# Patient Record
Sex: Male | Born: 1950 | Race: White | Hispanic: No | Marital: Single | Smoking: Never smoker
Health system: Southern US, Community
[De-identification: ages and names within clinical notes are randomized; demographics above are authoritative.]

## PROBLEM LIST (undated history)

## (undated) DIAGNOSIS — K589 Irritable bowel syndrome without diarrhea: Secondary | ICD-10-CM

## (undated) DIAGNOSIS — I10 Essential (primary) hypertension: Secondary | ICD-10-CM

## (undated) DIAGNOSIS — K5792 Diverticulitis of intestine, part unspecified, without perforation or abscess without bleeding: Secondary | ICD-10-CM

## (undated) DIAGNOSIS — F419 Anxiety disorder, unspecified: Secondary | ICD-10-CM

## (undated) DIAGNOSIS — E785 Hyperlipidemia, unspecified: Secondary | ICD-10-CM

## (undated) DIAGNOSIS — K219 Gastro-esophageal reflux disease without esophagitis: Secondary | ICD-10-CM

## (undated) DIAGNOSIS — R202 Paresthesia of skin: Secondary | ICD-10-CM

## (undated) HISTORY — PX: ABDOMINAL SURGERY: SHX537

## (undated) HISTORY — PX: EYE SURGERY: SHX253

## (undated) HISTORY — DX: Anxiety disorder, unspecified: F41.9

## (undated) HISTORY — DX: Paresthesia of skin: R20.2

## (undated) HISTORY — DX: Hyperlipidemia, unspecified: E78.5

---

## 2015-07-12 ENCOUNTER — Encounter (HOSPITAL_COMMUNITY): Payer: Self-pay | Admitting: Emergency Medicine

## 2015-07-12 ENCOUNTER — Emergency Department (HOSPITAL_COMMUNITY): Payer: BLUE CROSS/BLUE SHIELD

## 2015-07-12 ENCOUNTER — Inpatient Hospital Stay (HOSPITAL_COMMUNITY)
Admission: EM | Admit: 2015-07-12 | Discharge: 2015-07-21 | DRG: 392 | Disposition: A | Discharge status: Home / Self Care | Payer: BLUE CROSS/BLUE SHIELD | Attending: General Surgery | Admitting: General Surgery

## 2015-07-12 DIAGNOSIS — K5720 Diverticulitis of large intestine with perforation and abscess without bleeding: Principal | ICD-10-CM | POA: Diagnosis present

## 2015-07-12 DIAGNOSIS — Z79899 Other long term (current) drug therapy: Secondary | ICD-10-CM

## 2015-07-12 DIAGNOSIS — R3915 Urgency of urination: Secondary | ICD-10-CM | POA: Diagnosis not present

## 2015-07-12 DIAGNOSIS — F419 Anxiety disorder, unspecified: Secondary | ICD-10-CM | POA: Diagnosis present

## 2015-07-12 DIAGNOSIS — K219 Gastro-esophageal reflux disease without esophagitis: Secondary | ICD-10-CM | POA: Diagnosis present

## 2015-07-12 DIAGNOSIS — I10 Essential (primary) hypertension: Secondary | ICD-10-CM | POA: Diagnosis present

## 2015-07-12 DIAGNOSIS — R109 Unspecified abdominal pain: Secondary | ICD-10-CM | POA: Diagnosis not present

## 2015-07-12 DIAGNOSIS — K5732 Diverticulitis of large intestine without perforation or abscess without bleeding: Secondary | ICD-10-CM

## 2015-07-12 HISTORY — DX: Essential (primary) hypertension: I10

## 2015-07-12 HISTORY — DX: Gastro-esophageal reflux disease without esophagitis: K21.9

## 2015-07-12 HISTORY — DX: Diverticulitis of intestine, part unspecified, without perforation or abscess without bleeding: K57.92

## 2015-07-12 HISTORY — DX: Irritable bowel syndrome, unspecified: K58.9

## 2015-07-12 LAB — COMPREHENSIVE METABOLIC PANEL
ALT: 25 U/L (ref 17–63)
AST: 24 U/L (ref 15–41)
Albumin: 4.8 g/dL (ref 3.5–5.0)
Alkaline Phosphatase: 81 U/L (ref 38–126)
Anion gap: 10 (ref 5–15)
BUN: 12 mg/dL (ref 6–20)
CO2: 24 mmol/L (ref 22–32)
Calcium: 9.7 mg/dL (ref 8.9–10.3)
Chloride: 104 mmol/L (ref 101–111)
Creatinine, Ser: 0.93 mg/dL (ref 0.61–1.24)
GFR calc Af Amer: >60 mL/min (ref >60)
GFR calc non Af Amer: >60 mL/min (ref >60)
Glucose, Bld: 144 mg/dL — ABNORMAL HIGH (ref 65–99)
Potassium: 3.9 mmol/L (ref 3.5–5.1)
Sodium: 138 mmol/L (ref 135–145)
Total Bilirubin: 1.1 mg/dL (ref 0.3–1.2)
Total Protein: 7.7 g/dL (ref 6.5–8.1)

## 2015-07-12 LAB — CBC
HCT: 43.8 % (ref 39.0–52.0)
Hemoglobin: 15.1 g/dL (ref 13.0–17.0)
MCH: 30.1 pg (ref 26.0–34.0)
MCHC: 34.5 g/dL (ref 30.0–36.0)
MCV: 87.4 fL (ref 78.0–100.0)
Platelets: 325 10*3/uL (ref 150–400)
RBC: 5.01 MIL/uL (ref 4.22–5.81)
RDW: 13.3 % (ref 11.5–15.5)
WBC: 18.1 10*3/uL — ABNORMAL HIGH (ref 4.0–10.5)

## 2015-07-12 LAB — URINALYSIS, ROUTINE W REFLEX MICROSCOPIC
Bilirubin Urine: NEGATIVE
Glucose, UA: NEGATIVE mg/dL
Hgb urine dipstick: NEGATIVE
Ketones, ur: NEGATIVE mg/dL
Leukocytes, UA: NEGATIVE
Nitrite: NEGATIVE
Protein, ur: NEGATIVE mg/dL
Specific Gravity, Urine: 1.017 (ref 1.005–1.030)
pH: 7.5 (ref 5.0–8.0)

## 2015-07-12 LAB — LIPASE, BLOOD: Lipase: 34 U/L (ref 11–51)

## 2015-07-12 MED ORDER — SODIUM CHLORIDE 0.9 % IV BOLUS (SEPSIS)
1000.0000 mL | Freq: Once | INTRAVENOUS | Status: AC
  Administered 2015-07-12: 1000 mL via INTRAVENOUS

## 2015-07-12 MED ORDER — ACETAMINOPHEN 325 MG PO TABS
650.0000 mg | ORAL_TABLET | Freq: Four times a day (QID) | ORAL | Status: DC | PRN
  Administered 2015-07-13: 650 mg via ORAL
  Filled 2015-07-12: qty 2

## 2015-07-12 MED ORDER — ACETAMINOPHEN 650 MG RE SUPP
650.0000 mg | Freq: Four times a day (QID) | RECTAL | Status: DC | PRN
  Filled 2015-07-12: qty 1

## 2015-07-12 MED ORDER — ONDANSETRON 4 MG PO TBDP: 4.0000 mg | ORAL_TABLET | Freq: Four times a day (QID) | ORAL | Status: DC | PRN

## 2015-07-12 MED ORDER — IOPAMIDOL (ISOVUE-300) INJECTION 61%
100.0000 mL | Freq: Once | INTRAVENOUS | Status: AC | PRN
  Administered 2015-07-12: 100 mL via INTRAVENOUS

## 2015-07-12 MED ORDER — ERTAPENEM SODIUM 1 G IJ SOLR
1.0000 g | Freq: Once | INTRAMUSCULAR | Status: AC
  Administered 2015-07-12: 1 g via INTRAVENOUS
  Filled 2015-07-12: qty 1

## 2015-07-12 MED ORDER — ONDANSETRON HCL 4 MG/2ML IJ SOLN
4.0000 mg | Freq: Four times a day (QID) | INTRAMUSCULAR | Status: DC | PRN
  Administered 2015-07-12 – 2015-07-14 (×4): 4 mg via INTRAVENOUS
  Filled 2015-07-12 (×4): qty 2

## 2015-07-12 MED ORDER — MORPHINE SULFATE (PF) 4 MG/ML IV SOLN
4.0000 mg | Freq: Once | INTRAVENOUS | Status: AC
Start: 2015-07-12 — End: 2015-07-12
  Administered 2015-07-12: 4 mg via INTRAVENOUS
  Filled 2015-07-12: qty 1

## 2015-07-12 MED ORDER — HYDROMORPHONE HCL 1 MG/ML IJ SOLN
1.0000 mg | INTRAMUSCULAR | Status: DC | PRN
  Administered 2015-07-12 – 2015-07-15 (×11): 1 mg via INTRAVENOUS
  Filled 2015-07-12 (×11): qty 1

## 2015-07-12 NOTE — H&P (Signed)
Walter Freeman is an 65 y.o. male.    Chief Complaint: perforated diverticulitis  HPI: 89 yom who is fairly healthy with psh of IH presents with abdominal pain. He has history of colonoscopy about 10-12 years ago.  He was admitted for at least 2 weeks he states to hospital in [place] for diverticular disease.  He got better slowly but sounds like he was on some abx and antifungals.  He also eventually went for laparoscopic washout and drain placement. He was discharged it sounds like without drain about three days later. He has done pretty well since then and has moved to this area. Yesterday night he had acute onset abdominal pain.  He was able to sleep after using xanax and vicodin as directed by physician.  He then progressed today and there was no relief.  He is passing flatus.  He comes in and I was called after ct was performed.   Past Medical History  Diagnosis Date  . Diverticulitis   . GERD (gastroesophageal reflux disease)   . Hypertension   . IBS (irritable bowel syndrome)     Past Surgical History  Procedure Laterality Date  . Abdominal surgery    IH repair  No family history on file. Social History:  reports that he has never smoked. He does not have any smokeless tobacco history on file. He reports that he does not drink alcohol. His drug history is not on file.  Allergies:    meds antihtn  Results for orders placed or performed during the hospital encounter of 07/12/15 (from the past 48 hour(s))  Lipase, blood     Status: None   Collection Time: 07/12/15 12:17 PM  Result Value Ref Range   Lipase 34 11 - 51 U/L  Comprehensive metabolic panel     Status: Abnormal   Collection Time: 07/12/15 12:17 PM  Result Value Ref Range   Sodium 138 135 - 145 mmol/L   Potassium 3.9 3.5 - 5.1 mmol/L   Chloride 104 101 - 111 mmol/L   CO2 24 22 - 32 mmol/L   Glucose, Bld 144 (H) 65 - 99 mg/dL   BUN 12 6 - 20 mg/dL   Creatinine, Ser 0.93 0.61 - 1.24 mg/dL   Calcium 9.7 8.9 - 10.3  mg/dL   Total Protein 7.7 6.5 - 8.1 g/dL   Albumin 4.8 3.5 - 5.0 g/dL   AST 24 15 - 41 U/L   ALT 25 17 - 63 U/L   Alkaline Phosphatase 81 38 - 126 U/L   Total Bilirubin 1.1 0.3 - 1.2 mg/dL   GFR calc non Af Amer >60 >60 mL/min   GFR calc Af Amer >60 >60 mL/min    Comment: (NOTE) The eGFR has been calculated using the CKD EPI equation. This calculation has not been validated in all clinical situations. eGFR's persistently <60 mL/min signify possible Chronic Kidney Disease.    Anion gap 10 5 - 15  CBC     Status: Abnormal   Collection Time: 07/12/15 12:17 PM  Result Value Ref Range   WBC 18.1 (H) 4.0 - 10.5 K/uL   RBC 5.01 4.22 - 5.81 MIL/uL   Hemoglobin 15.1 13.0 - 17.0 g/dL   HCT 43.8 39.0 - 52.0 %   MCV 87.4 78.0 - 100.0 fL   MCH 30.1 26.0 - 34.0 pg   MCHC 34.5 30.0 - 36.0 g/dL   RDW 13.3 11.5 - 15.5 %   Platelets 325 150 - 400 K/uL  Urinalysis, Routine  w reflex microscopic (not at [facility])     Status: None   Collection Time: 07/12/15 12:17 PM  Result Value Ref Range   Color, Urine YELLOW YELLOW   APPearance CLEAR CLEAR   Specific Gravity, Urine 1.017 1.005 - 1.030   pH 7.5 5.0 - 8.0   Glucose, UA NEGATIVE NEGATIVE mg/dL   Hgb urine dipstick NEGATIVE NEGATIVE   Bilirubin Urine NEGATIVE NEGATIVE   Ketones, ur NEGATIVE NEGATIVE mg/dL   Protein, ur NEGATIVE NEGATIVE mg/dL   Nitrite NEGATIVE NEGATIVE   Leukocytes, UA NEGATIVE NEGATIVE    Comment: MICROSCOPIC NOT DONE ON URINES WITH NEGATIVE PROTEIN, BLOOD, LEUKOCYTES, NITRITE, OR GLUCOSE <1000 mg/dL.   Ct Abdomen Pelvis W Contrast  07/12/2015  CLINICAL DATA:  Abdominal pain starting last night. History of diverticulitis. EXAM: CT ABDOMEN AND PELVIS WITH CONTRAST TECHNIQUE: Multidetector CT imaging of the abdomen and pelvis was performed using the standard protocol following bolus administration of intravenous contrast. CONTRAST:  140m ISOVUE-300 IOPAMIDOL (ISOVUE-300) INJECTION 61% COMPARISON:  None. FINDINGS: Lower chest:   Mild bibasilar atelectasis. Hepatobiliary: Liver is low in density suggesting fatty infiltration. No focal mass or lesion within the liver. Gallbladder is mildly distended but otherwise unremarkable. No evidence of cholecystitis. No bile duct dilatation. Pancreas: No mass, inflammatory changes, or other significant abnormality. Spleen: Within normal limits in size and appearance. Adrenals/Urinary Tract: No masses identified. No evidence of hydronephrosis. Stomach/Bowel: There is thickening of the walls of the mid sigmoid colon, and paracolic fluid stranding noted about this segment of the sigmoid colon, with scattered diverticulosis throughout, consistent with acute diverticulitis. Ill-defined collection of fluid and extraluminal air noted within the central pelvis, suspicious for early developing abscess collection which measures approximately 2.3 cm greatest thickness and 4.9 cm craniocaudal dimension, indicating an adjacent perforated diverticulitis. Distal small bowel abuts this extraluminal collection, with associated thickening/inflammation of the walls of the small bowel, and possible associated fistulous connection. Remainder of the bowel appears normal in caliber and configuration. No other site of bowel wall thickening or inflammation. Appendix is normal. Free intraperitoneal air noted within the upper abdomen, again indicating bowel perforation. Vascular/Lymphatic: Scattered atherosclerotic changes of the normal- caliber abdominal aorta. No acute - appearing vascular abnormality. Reproductive: No mass or other significant abnormality. Other: Small amount of additional free fluid in the lower pelvis. Free intraperitoneal air within the upper abdomen, as above. Musculoskeletal: No acute or suspicious osseous lesion. Mild degenerative change within the lumbar spine. 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 IMPRESSION: 1. Thickening  of the walls of the mid sigmoid colon, with associated paracolic fluid stranding, consistent with acute diverticulitis. Free intraperitoneal air indicates associated bowel perforation (perforated diverticulitis). 2. Thin collection of fluid and extraluminal air within the midline pelvis, measuring 2.3 cm greatest thickness and approximately 4.9 cm craniocaudal dimension, located immediately adjacent to the segment of sigmoid colon diverticulitis, is highly suggestive of early developing abscess. 3. The complex collection in the midline pelvis, suspected to be early abscess formation, also abuts the adjacent distal small bowel, with associated thickening/inflammation of the walls of these small bowel loops which could be reactive thickening or indicate abscess involvement and/or fistulous connection. 4. As above, there is free intraperitoneal air within the upper abdomen, indicating bowel perforation (perforated sigmoid diverticulitis). Critical Value/emergent results were called by telephone at the time of interpretation on 07/12/2015 at 8:03 pm to Dr. CDalia Heading, who verbally acknowledged these results. Electronically Signed   By:  Franki Cabot M.D.   On: 07/12/2015 20:11    Review of Systems  Constitutional: Negative for fever and chills.  Respiratory: Negative for cough and shortness of breath.   Cardiovascular: Negative for chest pain.  Gastrointestinal: Positive for nausea and abdominal pain. Negative for vomiting.    Blood pressure 134/64, pulse 111, temperature 98.2 F (36.8 C), temperature source Oral, resp. rate 18, SpO2 95 %. Physical Exam  Vitals reviewed. Constitutional: He appears well-developed and well-nourished.  Eyes: No scleral icterus.  Cardiovascular: Normal rate, regular rhythm and normal heart sounds.   Respiratory: Effort normal and breath sounds normal. He has no wheezes. He has no rales.  GI: He exhibits no distension. Bowel sounds are decreased. There is tenderness  in the periumbilical area and left lower quadrant. There is guarding. No hernia.  Lymphadenopathy:    He has no cervical adenopathy.     Assessment/Plan Perforated diverticulitis  He is suprisingly well given ct scan.  I am certainly concerned about his presentation. I discussed option of going to or tonight for sigmoid colectomy and colostomy vs attempt (and hope that perforation has sealed and is not ongoing as there appears to be early abscess in pelvis) to treat conservatively (possibly drain and abx).  He understands that this may fail and if he worsens he will need surgery tonight.  He also understands this may fail and need surgery in next 24-48 hours.  I think this is not unreasonable given the way he looks right now. Will proceed with monitoring, recheck labs in am, abx, npo and hopefully gets better quickly. If he worsens or does not improve he will need hartmanns procedure.   Rolm Bookbinder, MD 07/12/2015, 9:57 PM

## 2015-07-12 NOTE — ED Notes (Signed)
Patient transported to CT 

## 2015-07-12 NOTE — Progress Notes (Signed)
Patient listed as having [employer] insurance without a pcp.  EDCM spoke to patient at bedside.  Patient confirms he does not have a pcp.  EDCM offered to give patient list of pcps who accept [employer] insurance, however EDCM unable to do so at this time as [employer] web site is not functioning properly.  EDCM instructed patient to cal lthe phone number on the back of his insurance card to assist him in finding a pcp who is close to him and within network.  Patient thankful for services.  No further EDCM needs at this time.

## 2015-07-12 NOTE — ED Notes (Signed)
Per [facility] pt from home reports abd pain started last night , denies nausea nor emesis, no diarrhea. Hx diverticulitis . HX HTN , per EMS BP is 170/96. Alert and oriented x 4. Ambulated from stretcher to triage room , took 1 oxycodone 30 min ago .

## 2015-07-12 NOTE — ED Provider Notes (Signed)
CSN: [id number]     Arrival date & time 07/12/15  1158 History   First MD Initiated Contact with Patient 07/12/15 1622     Chief Complaint  Patient presents with  . Abdominal Pain     (Consider location/radiation/quality/duration/timing/severity/associated sxs/prior Treatment) HPI Patient presents to the emergency department with worsening abdominal pain since last night.  The patient states back in January and diverticulitis with microperforation and was [place].  The patient states he was admitted to the hospital for 3 weeks.  Patient states that time he had a laparoscopic lavaged on to help with healing of the area.  Patient states that he has been having no difficulties with eating or bowel movement since that time.  Patient states that he recently moved to [place].  Patient states that he took pain medication that he had previously from his other hospitalization.  Patient states that palpation of his abdomen makes the pain worse.The patient denies chest pain, shortness of breath, headache,blurred vision, neck pain, fever, cough, weakness, numbness, dizziness, anorexia, edema, vomiting, diarrhea, rash, back pain, dysuria, hematemesis, bloody stool, near syncope, or syncope. Past Medical History  Diagnosis Date  . Diverticulitis   . GERD (gastroesophageal reflux disease)   . Hypertension   . IBS (irritable bowel syndrome)    Past Surgical History  Procedure Laterality Date  . Abdominal surgery     No family history on file. Social History  Substance Use Topics  . Smoking status: Never Smoker   . Smokeless tobacco: None  . Alcohol Use: No    Review of Systems  All other systems negative except as documented in the HPI. All pertinent positives and negatives as reviewed in the HPI.  Allergies  Lisinopril; Other; and Penicillins  Home Medications   Prior to Admission medications   Medication Sig Start Date End Date Taking? Authorizing  Provider  ALPRAZolam (XANAX) 0.25 MG tablet Take 0.25 mg by mouth 4 (four) times daily as needed for anxiety.   Yes Historical Provider, MD  amLODipine (NORVASC) 10 MG tablet Take 10 mg by mouth daily.   Yes Historical Provider, MD  HYDROcodone-acetaminophen (NORCO/VICODIN) 5-325 MG tablet Take 1-2 tablets by mouth every 4 (four) hours as needed (pain.).   Yes Historical Provider, MD  omeprazole (PRILOSEC) 20 MG capsule Take 20 mg by mouth daily.   Yes Historical Provider, MD   BP 147/83 mmHg  Pulse 106  Temp(Src) 98.2 F (36.8 C) (Oral)  Resp 16  SpO2 98% Physical Exam  Constitutional: He is oriented to person, place, and time. He appears well-developed and well-nourished. No distress.  HENT:  Head: Normocephalic and atraumatic.  Mouth/Throat: Oropharynx is clear and moist.  Eyes: Pupils are equal, round, and reactive to light.  Neck: Normal range of motion. Neck supple.  Cardiovascular: Normal rate, regular rhythm and normal heart sounds.  Exam reveals no gallop and no friction rub.   No murmur heard. Pulmonary/Chest: Effort normal and breath sounds normal. No respiratory distress. He has no wheezes.  Abdominal: Soft. Bowel sounds are normal. He exhibits no distension. There is tenderness. There is no rebound and no guarding.  Neurological: He is alert and oriented to person, place, and time. He exhibits normal muscle tone. Coordination normal.  Skin: Skin is warm and dry. No rash noted. No erythema.  Psychiatric: He has a normal mood and affect. His behavior is normal.  Nursing note and vitals reviewed.   ED Course  Procedures (including critical  care time) Labs Review Labs Reviewed  COMPREHENSIVE METABOLIC PANEL - Abnormal; Notable for the following:    Glucose, Bld 144 (*)    All other components within normal limits  CBC - Abnormal; Notable for the following:    WBC 18.1 (*)    All other components within normal limits  LIPASE, BLOOD  URINALYSIS, ROUTINE W REFLEX  MICROSCOPIC (NOT AT [facility])    Imaging Review Ct Abdomen Pelvis W Contrast  07/12/2015  CLINICAL DATA:  Abdominal pain starting last night. History of diverticulitis. EXAM: CT ABDOMEN AND PELVIS WITH CONTRAST TECHNIQUE: Multidetector CT imaging of the abdomen and pelvis was performed using the standard protocol following bolus administration of intravenous contrast. CONTRAST:  ISOVUE-300 IOPAMIDOL (ISOVUE-300) INJECTION 61% COMPARISON:  None. FINDINGS: Lower chest:  Mild bibasilar atelectasis. Hepatobiliary: Liver is low in density suggesting fatty infiltration. No focal mass or lesion within the liver. Gallbladder is mildly distended but otherwise unremarkable. No evidence of cholecystitis. No bile duct dilatation. Pancreas: No mass, inflammatory changes, or other significant abnormality. Spleen: Within normal limits in size and appearance. Adrenals/Urinary Tract: No masses identified. No evidence of hydronephrosis. Stomach/Bowel: There is thickening of the walls of the mid sigmoid colon, and paracolic fluid stranding noted about this segment of the sigmoid colon, with scattered diverticulosis throughout, consistent with acute diverticulitis. Ill-defined collection of fluid and extraluminal air noted within the central pelvis, suspicious for early developing abscess collection which measures approximately 2.3 cm greatest thickness and 4.9 cm craniocaudal dimension, indicating an adjacent perforated diverticulitis. Distal small bowel abuts this extraluminal collection, with associated thickening/inflammation of the walls of the small bowel, and possible associated fistulous connection. Remainder of the bowel appears normal in caliber and configuration. No other site of bowel wall thickening or inflammation. Appendix is normal. Free intraperitoneal air noted within the upper abdomen, again indicating bowel perforation. Vascular/Lymphatic: Scattered atherosclerotic changes of the normal- caliber abdominal  aorta. No acute - appearing vascular abnormality. Reproductive: No mass or other significant abnormality. Other: Small amount of additional free fluid in the lower pelvis. Free intraperitoneal air within the upper abdomen, as above. Musculoskeletal: No acute or suspicious osseous lesion. Mild degenerative change within the lumbar spine. 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 IMPRESSION: 1. Thickening of the walls of the mid sigmoid colon, with associated paracolic fluid stranding, consistent with acute diverticulitis. Free intraperitoneal air indicates associated bowel perforation (perforated diverticulitis). 2. Thin collection of fluid and extraluminal air within the midline pelvis, measuring 2.3 cm greatest thickness and approximately 4.9 cm craniocaudal dimension, located immediately adjacent to the segment of sigmoid colon diverticulitis, is highly suggestive of early developing abscess. 3. The complex collection in the midline pelvis, suspected to be early abscess formation, also abuts the adjacent distal small bowel, with associated thickening/inflammation of the walls of these small bowel loops which could be reactive thickening or indicate abscess involvement and/or fistulous connection. 4. As above, there is free intraperitoneal air within the upper abdomen, indicating bowel perforation (perforated sigmoid diverticulitis). Critical Value/emergent results were called by telephone at the time of interpretation on 07/12/2015 at 8:03 pm to Dr. Charlestine Night , who verbally acknowledged these results. Electronically Signed   By: Bary Richard M.D.   On: 07/12/2015 20:11   I have personally reviewed and evaluated these images and lab results as part of my medical decision-making.  I spoke with general surgery about the patient and he reviewed his CT scan.  He  will be in to evaluate the patient for admission.  Patient started on  Invanz. patient is advised plan and all questions were answered   Charlestine Night, PA-C 07/13/15 0154  Benjiman Core, MD 07/14/15 [phone]

## 2015-07-13 ENCOUNTER — Encounter (HOSPITAL_COMMUNITY): Payer: Self-pay | Admitting: Rehabilitation

## 2015-07-13 LAB — CBC
HCT: 39.3 % (ref 39.0–52.0)
Hemoglobin: 13.1 g/dL (ref 13.0–17.0)
MCH: 30.2 pg (ref 26.0–34.0)
MCHC: 33.3 g/dL (ref 30.0–36.0)
MCV: 90.6 fL (ref 78.0–100.0)
Platelets: 273 10*3/uL (ref 150–400)
RBC: 4.34 MIL/uL (ref 4.22–5.81)
RDW: 13.4 % (ref 11.5–15.5)
WBC: 19.1 10*3/uL — ABNORMAL HIGH (ref 4.0–10.5)

## 2015-07-13 LAB — BASIC METABOLIC PANEL
Anion gap: 10 (ref 5–15)
BUN: 12 mg/dL (ref 6–20)
CO2: 24 mmol/L (ref 22–32)
Calcium: 8.7 mg/dL — ABNORMAL LOW (ref 8.9–10.3)
Chloride: 107 mmol/L (ref 101–111)
Creatinine, Ser: 0.94 mg/dL (ref 0.61–1.24)
GFR calc Af Amer: >60 mL/min (ref >60)
GFR calc non Af Amer: >60 mL/min (ref >60)
Glucose, Bld: 138 mg/dL — ABNORMAL HIGH (ref 65–99)
Potassium: 4 mmol/L (ref 3.5–5.1)
Sodium: 141 mmol/L (ref 135–145)

## 2015-07-13 MED ORDER — LORAZEPAM 2 MG/ML IJ SOLN
1.0000 mg | Freq: Four times a day (QID) | INTRAMUSCULAR | Status: DC | PRN
  Administered 2015-07-13 – 2015-07-15 (×6): 1 mg via INTRAVENOUS
  Filled 2015-07-13 (×6): qty 1

## 2015-07-13 MED ORDER — SODIUM CHLORIDE 0.9 % IV SOLN
500.0000 mg | Freq: Four times a day (QID) | INTRAVENOUS | Status: DC
  Administered 2015-07-13 – 2015-07-20 (×31): 500 mg via INTRAVENOUS
  Filled 2015-07-13 (×31): qty 500

## 2015-07-13 MED ORDER — ENOXAPARIN SODIUM 40 MG/0.4ML ~~LOC~~ SOLN
40.0000 mg | SUBCUTANEOUS | Status: DC
  Administered 2015-07-13 – 2015-07-21 (×8): 40 mg via SUBCUTANEOUS
  Filled 2015-07-13 (×9): qty 0.4

## 2015-07-13 MED ORDER — PROCHLORPERAZINE EDISYLATE 5 MG/ML IJ SOLN
5.0000 mg | Freq: Four times a day (QID) | INTRAMUSCULAR | Status: DC | PRN
  Administered 2015-07-13: 5 mg via INTRAVENOUS
  Filled 2015-07-13: qty 2

## 2015-07-13 MED ORDER — SODIUM CHLORIDE 0.9 % IV SOLN
INTRAVENOUS | Status: DC
  Administered 2015-07-13 – 2015-07-16 (×10): via INTRAVENOUS
  Administered 2015-07-18: 50 mL via INTRAVENOUS
  Administered 2015-07-19 – 2015-07-21 (×4): via INTRAVENOUS

## 2015-07-13 MED ORDER — PANTOPRAZOLE SODIUM 40 MG IV SOLR
40.0000 mg | Freq: Every day | INTRAVENOUS | Status: DC
  Administered 2015-07-13 – 2015-07-16 (×5): 40 mg via INTRAVENOUS
  Filled 2015-07-13 (×5): qty 40

## 2015-07-13 NOTE — Progress Notes (Signed)
Pharmacy Antibiotic Note  Walter ManMichael Freeman is a 65 y.o. male admitted on 07/12/2015 with perforated diverticulitis.  Pharmacy has been consulted for Primaxin dosing.  Plan: Primaxin 500mg  iv q6hr  Height: 5\' 11"  (180.3 cm) Weight: 180 lb 12.4 oz (82 kg) IBW/kg (Calculated) : 75.3  Temp (24hrs), Avg:99.8 F (37.7 C), Min:98.2 F (36.8 C), Max:102.1 F (38.9 C)   Recent Labs Lab 07/12/15 1217  WBC 18.1*  CREATININE 0.93    Estimated Creatinine Clearance: 86.6 mL/min (by C-G formula based on Cr of 0.93).    Allergies  Allergen Reactions  . Lisinopril Anaphylaxis  . Other     EITHER Bactrim OR Biaxin; pt does not remember which.  Gas and bloating.   Mahi mahi fish caused lips to swell.    Marland Kitchen. Penicillins Swelling and Rash        Antimicrobials this admission: Primaxin 3/31 >>     Microbiology results: Pending  Thank you for allowing pharmacy to be a part of this patient's care.  Aleene DavidsonGrimsley Jr, Americus Perkey Crowford 07/13/2015 5:43 AM

## 2015-07-13 NOTE — Progress Notes (Signed)
Patient ID: Walter Freeman, male   DOB: 08/09/1950, 65 y.o.   MRN: [id number]     [facility]      [address] [place], [place] [address]    Phone: [phone] FAX: [phone]     Subjective: Pain unchanged.  102 temp at 2300.  BP stable.  No tachycardia or hypotension.  Objective:  Vital signs:  Filed Vitals:   07/12/15 2318 07/12/15 2343 07/13/15 0004 07/13/15 0531  BP: 130/72  135/75 131/66  Pulse: 105  103 97  Temp:  102.1 F (38.9 C) 99.8 F (37.7 C) 98.9 F (37.2 C)  TempSrc:  Oral Oral Oral  Resp: _0 Height:   _1  (1.803 m)   Weight:   82 kg (180 lb 12.4 oz)   SpO2: 95%  94% 95%       Intake/Output   Yesterday:  03/30 0701 - 03/31 0700 In: -  Out: 175 [Urine:175] This shift:    I/O last 3 completed shifts: In: -  Out: 175 [Urine:175]    Physical Exam: General: Pt awake/alert/oriented x4 in no acute distress Chest: cta.  No chest wall pain w good excursion CV:  Pulses intact.  Regular rhythm MS: Normal AROM mjr joints.  No obvious deformity Abdomen:hypoactive bowel sounds, abdomen is soft, generalized tenderness with voluntary guarding.  No incarcerated hernias. Ext:  SCDs BLE.  No mjr edema.  No cyanosis Skin: No petechiae / purpura   Problem List:   Active Problems:   Perforated diverticulum of large intestine    Results:   Labs: Results for orders placed or performed during the hospital encounter of 07/12/15 (from the past 48 hour(s))  Lipase, blood     Status: None   Collection Time: 07/12/15 12:17 PM  Result Value Ref Range   Lipase 34 11 - 51 U/L  Comprehensive metabolic panel     Status: Abnormal   Collection Time: 07/12/15 12:17 PM  Result Value Ref Range   Sodium 138 135 - 145 mmol/L   Potassium 3.9 3.5 - 5.1 mmol/L   Chloride 104 101 - 111 mmol/L   CO2 24 22 - 32 mmol/L   Glucose, Bld 144 (H) 65 - 99 mg/dL   BUN 12 6 - 20 mg/dL   Creatinine, Ser 0.93 0.61 - 1.24  mg/dL   Calcium 9.7 8.9 - 10.3 mg/dL   Total Protein 7.7 6.5 - 8.1 g/dL   Albumin 4.8 3.5 - 5.0 g/dL   AST 24 15 - 41 U/L   ALT 25 17 - 63 U/L   Alkaline Phosphatase 81 38 - 126 U/L   Total Bilirubin 1.1 0.3 - 1.2 mg/dL   GFR calc non Af Amer >60 >60 mL/min   GFR calc Af Amer >60 >60 mL/min    Comment: (NOTE) The eGFR has been calculated using the CKD EPI equation. This calculation has not been validated in all clinical situations. eGFR's persistently <60 mL/min signify possible Chronic Kidney Disease.    Anion gap 10 5 - 15  CBC     Status: Abnormal   Collection Time: 07/12/15 12:17 PM  Result Value Ref Range   WBC 18.1 (H) 4.0 - 10.5 K/uL   RBC 5.01 4.22 - 5.81 MIL/uL   Hemoglobin 15.1 13.0 - 17.0 g/dL   HCT 43.8 39.0 - 52.0 %   MCV 87.4 78.0 - 100.0 fL   MCH 30.1 26.0 - 34.0 pg   MCHC  34.5 30.0 - 36.0 g/dL   RDW 13.3 11.5 - 15.5 %   Platelets 325 150 - 400 K/uL  Urinalysis, Routine w reflex microscopic (not at [facility])     Status: None   Collection Time: 07/12/15 12:17 PM  Result Value Ref Range   Color, Urine YELLOW YELLOW   APPearance CLEAR CLEAR   Specific Gravity, Urine 1.017 1.005 - 1.030   pH 7.5 5.0 - 8.0   Glucose, UA NEGATIVE NEGATIVE mg/dL   Hgb urine dipstick NEGATIVE NEGATIVE   Bilirubin Urine NEGATIVE NEGATIVE   Ketones, ur NEGATIVE NEGATIVE mg/dL   Protein, ur NEGATIVE NEGATIVE mg/dL   Nitrite NEGATIVE NEGATIVE   Leukocytes, UA NEGATIVE NEGATIVE    Comment: MICROSCOPIC NOT DONE ON URINES WITH NEGATIVE PROTEIN, BLOOD, LEUKOCYTES, NITRITE, OR GLUCOSE <1000 mg/dL.  CBC     Status: Abnormal   Collection Time: 07/13/15  5:05 AM  Result Value Ref Range   WBC 19.1 (H) 4.0 - 10.5 K/uL   RBC 4.34 4.22 - 5.81 MIL/uL   Hemoglobin 13.1 13.0 - 17.0 g/dL   HCT 39.3 39.0 - 52.0 %   MCV 90.6 78.0 - 100.0 fL   MCH 30.2 26.0 - 34.0 pg   MCHC 33.3 30.0 - 36.0 g/dL   RDW 13.4 11.5 - 15.5 %   Platelets 273 150 - 400 K/uL  Basic metabolic panel     Status: Abnormal    Collection Time: 07/13/15  5:05 AM  Result Value Ref Range   Sodium 141 135 - 145 mmol/L   Potassium 4.0 3.5 - 5.1 mmol/L   Chloride 107 101 - 111 mmol/L   CO2 24 22 - 32 mmol/L   Glucose, Bld 138 (H) 65 - 99 mg/dL   BUN 12 6 - 20 mg/dL   Creatinine, Ser 0.94 0.61 - 1.24 mg/dL   Calcium 8.7 (L) 8.9 - 10.3 mg/dL   GFR calc non Af Amer >60 >60 mL/min   GFR calc Af Amer >60 >60 mL/min    Comment: (NOTE) The eGFR has been calculated using the CKD EPI equation. This calculation has not been validated in all clinical situations. eGFR's persistently <60 mL/min signify possible Chronic Kidney Disease.    Anion gap 10 5 - 15    Imaging / Studies: Ct Abdomen Pelvis W Contrast  07/12/2015  CLINICAL DATA:  Abdominal pain starting last night. History of diverticulitis. EXAM: CT ABDOMEN AND PELVIS WITH CONTRAST TECHNIQUE: Multidetector CT imaging of the abdomen and pelvis was performed using the standard protocol following bolus administration of intravenous contrast. CONTRAST:  168m ISOVUE-300 IOPAMIDOL (ISOVUE-300) INJECTION 61% COMPARISON:  None. FINDINGS: Lower chest:  Mild bibasilar atelectasis. Hepatobiliary: Liver is low in density suggesting fatty infiltration. No focal mass or lesion within the liver. Gallbladder is mildly distended but otherwise unremarkable. No evidence of cholecystitis. No bile duct dilatation. Pancreas: No mass, inflammatory changes, or other significant abnormality. Spleen: Within normal limits in size and appearance. Adrenals/Urinary Tract: No masses identified. No evidence of hydronephrosis. Stomach/Bowel: There is thickening of the walls of the mid sigmoid colon, and paracolic fluid stranding noted about this segment of the sigmoid colon, with scattered diverticulosis throughout, consistent with acute diverticulitis. Ill-defined collection of fluid and extraluminal air noted within the central pelvis, suspicious for early developing abscess collection which measures  approximately 2.3 cm greatest thickness and 4.9 cm craniocaudal dimension, indicating an adjacent perforated diverticulitis. Distal small bowel abuts this extraluminal collection, with associated thickening/inflammation of the walls of the small  bowel, and possible associated fistulous connection. Remainder of the bowel appears normal in caliber and configuration. No other site of bowel wall thickening or inflammation. Appendix is normal. Free intraperitoneal air noted within the upper abdomen, again indicating bowel perforation. Vascular/Lymphatic: Scattered atherosclerotic changes of the normal- caliber abdominal aorta. No acute - appearing vascular abnormality. Reproductive: No mass or other significant abnormality. Other: Small amount of additional free fluid in the lower pelvis. Free intraperitoneal air within the upper abdomen, as above. Musculoskeletal: No acute or suspicious osseous lesion. Mild degenerative change within the lumbar spine. 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 IMPRESSION: 1. Thickening of the walls of the mid sigmoid colon, with associated paracolic fluid stranding, consistent with acute diverticulitis. Free intraperitoneal air indicates associated bowel perforation (perforated diverticulitis). 2. Thin collection of fluid and extraluminal air within the midline pelvis, measuring 2.3 cm greatest thickness and approximately 4.9 cm craniocaudal dimension, located immediately adjacent to the segment of sigmoid colon diverticulitis, is highly suggestive of early developing abscess. 3. The complex collection in the midline pelvis, suspected to be early abscess formation, also abuts the adjacent distal small bowel, with associated thickening/inflammation of the walls of these small bowel loops which could be reactive thickening or indicate abscess involvement and/or fistulous connection. 4. As above, there is free  intraperitoneal air within the upper abdomen, indicating bowel perforation (perforated sigmoid diverticulitis). Critical Value/emergent results were called by telephone at the time of interpretation on 07/12/2015 at 8:03 pm to Dr. Dalia Heading , who verbally acknowledged these results. Electronically Signed   By: Franki Cabot M.D.   On: 07/12/2015 20:11    Medications / Allergies:  Scheduled Meds: . enoxaparin (LOVENOX) injection  40 mg Subcutaneous Q24H  . imipenem-cilastatin  500 mg Intravenous 4 times per day  . pantoprazole (PROTONIX) IV  40 mg Intravenous QHS   Continuous Infusions: . sodium chloride 125 mL/hr at 07/13/15 0019   PRN Meds:.acetaminophen **OR** acetaminophen, HYDROmorphone (DILAUDID) injection, LORazepam, ondansetron **OR** ondansetron (ZOFRAN) IV, prochlorperazine  Antibiotics: Anti-infectives    Start     Dose/Rate Route Frequency Ordered Stop   07/13/15 0030  imipenem-cilastatin (PRIMAXIN) 500 mg in sodium chloride 0.9 % 100 mL IVPB     500 mg 200 mL/hr over 30 Minutes Intravenous 4 times per day 07/13/15 0022     07/12/15 2100  ertapenem (INVANZ) 1 g in sodium chloride 0.9 % 50 mL IVPB     1 g 100 mL/hr over 30 Minutes Intravenous  Once 07/12/15 2059 07/12/15 2206        Assessment/Plan Perforated diverticulitis  -temp 102 at 2300 and still tender, exhibits voluntary guarding.  He may require a hartmann's. Discussed with Dr. Hassell Done, will see the patient shortly.   Continue serial abdominal exams,  Bowel rest, Invanz.  Will ask IR if able to drain abscess, but doubtful.   ID-Invanz D#1 FEN-NPO, IVF, add compazine  VTE prophylaxis-SCD/lovenox Dispo-continue inaptient   Erby Pian, ANP-BC [facility] Pager [phone])   07/13/2015 8:55 AM

## 2015-07-13 NOTE — Care Management Note (Signed)
Case Management Note  Patient Details  Name: [name] MRN: [id number] Date of Birth: 12/09/1950  Subjective/Objective: 65 y/o m admitted w/Perforated diverticulum. For surgery. From home.                   Action/Plan:d/c plan home.   Expected Discharge Date:                  Expected Discharge Plan:  Home/Self Care  In-House Referral:     Discharge planning Services  CM Consult  Post Acute Care Choice:    Choice offered to:     DME Arranged:    DME Agency:     HH Arranged:    HH Agency:     Status of Service:  In process, will continue to follow  Medicare Important Message Given:    Date Medicare IM Given:    Medicare IM give by:    Date Additional Medicare IM Given:    Additional Medicare Important Message give by:     If discussed at Long Length of Stay Meetings, dates discussed:    Additional Comments:  Lanier ClamMahabir, Katena Petitjean, RN 07/13/2015, 2:12 PM

## 2015-07-13 NOTE — Progress Notes (Signed)
Initial Nutrition Assessment  DOCUMENTATION CODES:   Not applicable  INTERVENTION:  - Diet advancement as medically feasible versus initiation of nutrition support - RD will continue to monitor for POC and assist based on this  NUTRITION DIAGNOSIS:   Inadequate oral intake related to inability to eat as evidenced by NPO status.  GOAL:   Patient will meet greater than or equal to 90% of their needs  MONITOR:   Weight trends, Labs, Skin, I & O's  REASON FOR ASSESSMENT:   Malnutrition Screening Tool  ASSESSMENT:   [id number] yom who is fairly healthy with psh of IH presents with abdominal pain. He has history of colonoscopy about 10-12 years ago. He was admitted for at least 2 weeks he states to hospital in [place] for diverticular disease. He got better slowly but sounds like he was on some abx and antifungals. He also eventually went for laparoscopic washout and drain placement. He was discharged it sounds like without drain about three days later. He has done pretty well since then and has moved to this area. Yesterday night he had acute onset abdominal pain. He was able to sleep after using xanax and vicodin as directed by physician. He then progressed today and there was no relief. He is passing flatus.  Pt seen for MST. BMI indicates overweight status. Pt has been NPO since admission and unable to meet needs. Pt sleeping soundly, snoring at time of visit and does not arouse to name call x3. No other individuals in pt's room to provide information from PTA. Per chart review, pt with hx of diverticular disease/diverticulitis. Notes also indicate that pt had diverticulitis with microperforation in January 2017 and was in the hospital for 3 weeks related to this event; note indicates that since that time pt reports no issues with eating or bowel habits.   Per surgery PA note at 0852 this AM, pt may require Hartmann's. Note also states plan to discuss abscess drainage with IR although  doubtful this will be feasible.  RD will continue to monitor POC and plans surrounding nutrition/need for nutrition support. No previous weight hx available in the chart. No muscle or fat wasting to upper body but unable to assess lower body this AM.  Not meeting needs. Medications reviewed. IVF: NS @ 125 mL/hr. Labs reviewed; Ca: 8.7 mg/dL.   Diet Order:  Diet NPO time specified  Skin:  Reviewed, no issues  Last BM:  PTA  Height:   Ht Readings from Last 1 Encounters:  07/13/15 5\' 11"  (1.803 m)    Weight:   Wt Readings from Last 1 Encounters:  07/13/15 180 lb 12.4 oz (82 kg)    Ideal Body Weight:  78.18 kg (kg)  BMI:  Body mass index is 25.22 kg/(m^2).  Estimated Nutritional Needs:   Kcal:  2050-2215 (25-27 kcal/kg)  Protein:  82-98 grams (1-1.2 grams/kg)  Fluid:  >/2 L/day  EDUCATION NEEDS:   No education needs identified at this time     Trenton GammonJessica Jamy Whyte, RD, LDN Inpatient Clinical Dietitian Pager # [phone] After hours/weekend pager # [phone]

## 2015-07-14 LAB — BASIC METABOLIC PANEL
Anion gap: 8 (ref 5–15)
BUN: 13 mg/dL (ref 6–20)
CO2: 24 mmol/L (ref 22–32)
Calcium: 8.6 mg/dL — ABNORMAL LOW (ref 8.9–10.3)
Chloride: 109 mmol/L (ref 101–111)
Creatinine, Ser: 1.05 mg/dL (ref 0.61–1.24)
GFR calc Af Amer: >60 mL/min (ref >60)
GFR calc non Af Amer: >60 mL/min (ref >60)
Glucose, Bld: 127 mg/dL — ABNORMAL HIGH (ref 65–99)
Potassium: 3.7 mmol/L (ref 3.5–5.1)
Sodium: 141 mmol/L (ref 135–145)

## 2015-07-14 LAB — CBC
HCT: 36.3 % — ABNORMAL LOW (ref 39.0–52.0)
Hemoglobin: 12.1 g/dL — ABNORMAL LOW (ref 13.0–17.0)
MCH: 30 pg (ref 26.0–34.0)
MCHC: 33.3 g/dL (ref 30.0–36.0)
MCV: 90.1 fL (ref 78.0–100.0)
Platelets: 236 10*3/uL (ref 150–400)
RBC: 4.03 MIL/uL — ABNORMAL LOW (ref 4.22–5.81)
RDW: 13.2 % (ref 11.5–15.5)
WBC: 19.6 10*3/uL — ABNORMAL HIGH (ref 4.0–10.5)

## 2015-07-14 NOTE — Progress Notes (Signed)
Subjective: He states that he feels a little better  Objective: Vital signs in last 24 hours: Temp:  [98.1 F (36.7 C)-100.3 F (37.9 C)] 99.1 F (37.3 C) (04/01 0517) Pulse Rate:  [92-109] 97 (04/01 0517) Resp:  [20] 20 (04/01 0517) BP: (116-133)/(60-78) 119/61 mmHg (04/01 0517) SpO2:  [91 %-96 %] 91 % (04/01 0517) Last BM Date: 07/14/15  Intake/Output from previous day: 03/31 0701 - 04/01 0700 In: 500 [I.V.:500] Out: 775 [Urine:775] Intake/Output this shift:    Resp: clear to auscultation bilaterally Cardio: regular rate and rhythm and not tachy GI: still has some mild diffuse tenderness but most of his abdomen is soft  Lab Results:   Recent Labs  07/13/15 0505 07/14/15 0533  WBC 19.1* 19.6*  HGB 13.1 12.1*  HCT 39.3 36.3*  PLT 273 236   BMET  Recent Labs  07/13/15 0505 07/14/15 0533  NA 141 141  K 4.0 3.7  CL 107 109  CO2 24 24  GLUCOSE 138* 127*  BUN 12 13  CREATININE 0.94 1.05  CALCIUM 8.7* 8.6*   PT/INR No results for input(s): LABPROT, INR in the last 72 hours. ABG No results for input(s): PHART, HCO3 in the last 72 hours.  Invalid input(s): PCO2, PO2  Studies/Results: Ct Abdomen Pelvis W Contrast  07/12/2015  CLINICAL DATA:  Abdominal pain starting last night. History of diverticulitis. EXAM: CT ABDOMEN AND PELVIS WITH CONTRAST TECHNIQUE: Multidetector CT imaging of the abdomen and pelvis was performed using the standard protocol following bolus administration of intravenous contrast. CONTRAST:  100mL ISOVUE-300 IOPAMIDOL (ISOVUE-300) INJECTION 61% COMPARISON:  None. FINDINGS: Lower chest:  Mild bibasilar atelectasis. Hepatobiliary: Liver is low in density suggesting fatty infiltration. No focal mass or lesion within the liver. Gallbladder is mildly distended but otherwise unremarkable. No evidence of cholecystitis. No bile duct dilatation. Pancreas: No mass, inflammatory changes, or other significant abnormality. Spleen: Within normal limits  in size and appearance. Adrenals/Urinary Tract: No masses identified. No evidence of hydronephrosis. Stomach/Bowel: There is thickening of the walls of the mid sigmoid colon, and paracolic fluid stranding noted about this segment of the sigmoid colon, with scattered diverticulosis throughout, consistent with acute diverticulitis. Ill-defined collection of fluid and extraluminal air noted within the central pelvis, suspicious for early developing abscess collection which measures approximately 2.3 cm greatest thickness and 4.9 cm craniocaudal dimension, indicating an adjacent perforated diverticulitis. Distal small bowel abuts this extraluminal collection, with associated thickening/inflammation of the walls of the small bowel, and possible associated fistulous connection. Remainder of the bowel appears normal in caliber and configuration. No other site of bowel wall thickening or inflammation. Appendix is normal. Free intraperitoneal air noted within the upper abdomen, again indicating bowel perforation. Vascular/Lymphatic: Scattered atherosclerotic changes of the normal- caliber abdominal aorta. No acute - appearing vascular abnormality. Reproductive: No mass or other significant abnormality. Other: Small amount of additional free fluid in the lower pelvis. Free intraperitoneal air within the upper abdomen, as above. Musculoskeletal: No acute or suspicious osseous lesion. Mild degenerative change within the lumbar spine. 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 IMPRESSION: 1. Thickening of the walls of the mid sigmoid colon, with associated paracolic fluid stranding, consistent with acute diverticulitis. Free intraperitoneal air indicates associated bowel perforation (perforated diverticulitis). 2. Thin collection of fluid and extraluminal air within the midline pelvis, measuring 2.3 cm greatest thickness and approximately 4.9 cm  craniocaudal dimension, located immediately adjacent to the segment of sigmoid colon diverticulitis, is  highly suggestive of early developing abscess. 3. The complex collection in the midline pelvis, suspected to be early abscess formation, also abuts the adjacent distal small bowel, with associated thickening/inflammation of the walls of these small bowel loops which could be reactive thickening or indicate abscess involvement and/or fistulous connection. 4. As above, there is free intraperitoneal air within the upper abdomen, indicating bowel perforation (perforated sigmoid diverticulitis). Critical Value/emergent results were called by telephone at the time of interpretation on 07/12/2015 at 8:03 pm to Dr. Charlestine Night , who verbally acknowledged these results. Electronically Signed   By: Bary Richard M.D.   On: 07/12/2015 20:11    Anti-infectives: Anti-infectives    Start     Dose/Rate Route Frequency Ordered Stop   07/13/15 0030  imipenem-cilastatin (PRIMAXIN) 500 mg in sodium chloride 0.9 % 100 mL IVPB     500 mg 200 mL/hr over 30 Minutes Intravenous 4 times per day 07/13/15 0022     07/12/15 2100  ertapenem (INVANZ) 1 g in sodium chloride 0.9 % 50 mL IVPB     1 g 100 mL/hr over 30 Minutes Intravenous  Once 07/12/15 2059 07/12/15 2206      Assessment/Plan: s/p * No surgery found * Continue bowel rest until pain improves  Continue primaxin Although he is improving there is still concern. Will monitor closely  LOS: 2 days    TOTH III,Leelyn Jasinski S 07/14/2015

## 2015-07-15 LAB — BASIC METABOLIC PANEL
Anion gap: 8 (ref 5–15)
BUN: 9 mg/dL (ref 6–20)
CO2: 23 mmol/L (ref 22–32)
Calcium: 8.8 mg/dL — ABNORMAL LOW (ref 8.9–10.3)
Chloride: 108 mmol/L (ref 101–111)
Creatinine, Ser: 0.8 mg/dL (ref 0.61–1.24)
GFR calc Af Amer: >60 mL/min (ref >60)
GFR calc non Af Amer: >60 mL/min (ref >60)
Glucose, Bld: 106 mg/dL — ABNORMAL HIGH (ref 65–99)
Potassium: 3.3 mmol/L — ABNORMAL LOW (ref 3.5–5.1)
Sodium: 139 mmol/L (ref 135–145)

## 2015-07-15 LAB — CBC WITH DIFFERENTIAL/PLATELET
Basophils Absolute: 0 10*3/uL (ref 0.0–0.1)
Basophils Relative: 0 %
Eosinophils Absolute: 0.3 10*3/uL (ref 0.0–0.7)
Eosinophils Relative: 2 %
HCT: 37.1 % — ABNORMAL LOW (ref 39.0–52.0)
Hemoglobin: 12.7 g/dL — ABNORMAL LOW (ref 13.0–17.0)
Lymphocytes Relative: 13 %
Lymphs Abs: 1.4 10*3/uL (ref 0.7–4.0)
MCH: 30.1 pg (ref 26.0–34.0)
MCHC: 34.2 g/dL (ref 30.0–36.0)
MCV: 87.9 fL (ref 78.0–100.0)
Monocytes Absolute: 1.1 10*3/uL — ABNORMAL HIGH (ref 0.1–1.0)
Monocytes Relative: 10 %
Neutro Abs: 8.1 10*3/uL — ABNORMAL HIGH (ref 1.7–7.7)
Neutrophils Relative %: 75 %
Platelets: 265 10*3/uL (ref 150–400)
RBC: 4.22 MIL/uL (ref 4.22–5.81)
RDW: 12.7 % (ref 11.5–15.5)
WBC: 10.8 10*3/uL — ABNORMAL HIGH (ref 4.0–10.5)

## 2015-07-15 NOTE — Progress Notes (Addendum)
  Subjective: Feels a little better. Had 2 bm's yesterday  Objective: Vital signs in last 24 hours: Temp:  [98.1 F (36.7 C)-98.6 F (37 C)] 98.1 F (36.7 C) (04/02 0455) Pulse Rate:  [80-90] 80 (04/02 0455) Resp:  [16-18] 16 (04/02 0455) BP: (129-132)/(65-71) 132/71 mmHg (04/02 0455) SpO2:  [93 %-97 %] 97 % (04/02 0455) Last BM Date: 07/15/15  Intake/Output from previous day: 04/01 0701 - 04/02 0700 In: 3181.3 [I.V.:2781.3; IV Piggyback:400] Out: 2025 [Urine:2025] Intake/Output this shift: Total I/O In: -  Out: 300 [Urine:300]  Resp: clear to auscultation bilaterally Cardio: regular rate and rhythm GI: soft, minimal tenderness. good bs  Lab Results:   Recent Labs  07/13/15 0505 07/14/15 0533  WBC 19.1* 19.6*  HGB 13.1 12.1*  HCT 39.3 36.3*  PLT 273 236   BMET  Recent Labs  07/13/15 0505 07/14/15 0533  NA 141 141  K 4.0 3.7  CL 107 109  CO2 24 24  GLUCOSE 138* 127*  BUN 12 13  CREATININE 0.94 1.05  CALCIUM 8.7* 8.6*   PT/INR No results for input(s): LABPROT, INR in the last 72 hours. ABG No results for input(s): PHART, HCO3 in the last 72 hours.  Invalid input(s): PCO2, PO2  Studies/Results: No results found.  Anti-infectives: Anti-infectives    Start     Dose/Rate Route Frequency Ordered Stop   07/13/15 0030  imipenem-cilastatin (PRIMAXIN) 500 mg in sodium chloride 0.9 % 100 mL IVPB     500 mg 200 mL/hr over 30 Minutes Intravenous 4 times per day 07/13/15 0022     07/12/15 2100  ertapenem (INVANZ) 1 g in sodium chloride 0.9 % 50 mL IVPB     1 g 100 mL/hr over 30 Minutes Intravenous  Once 07/12/15 2059 07/12/15 2206      Assessment/Plan: s/p * No surgery found * Advance diet. Allow clears today Continue primaxin day 2 Check wbc today  LOS: 3 days    TOTH III,Walter Freeman S 07/15/2015

## 2015-07-15 NOTE — Progress Notes (Signed)
Resumed care of patient. Agree with previous assessment. Orders reviewed and will continue to monitor. 

## 2015-07-16 LAB — CBC
HCT: 36.1 % — ABNORMAL LOW (ref 39.0–52.0)
Hemoglobin: 12.6 g/dL — ABNORMAL LOW (ref 13.0–17.0)
MCH: 29.4 pg (ref 26.0–34.0)
MCHC: 34.9 g/dL (ref 30.0–36.0)
MCV: 84.3 fL (ref 78.0–100.0)
Platelets: 292 10*3/uL (ref 150–400)
RBC: 4.28 MIL/uL (ref 4.22–5.81)
RDW: 12.5 % (ref 11.5–15.5)
WBC: 7.3 10*3/uL (ref 4.0–10.5)

## 2015-07-16 MED ORDER — DIPHENHYDRAMINE HCL 50 MG/ML IJ SOLN
25.0000 mg | Freq: Once | INTRAMUSCULAR | Status: AC
  Administered 2015-07-16: 25 mg via INTRAVENOUS
  Filled 2015-07-16: qty 1

## 2015-07-16 MED ORDER — ALPRAZOLAM 0.25 MG PO TABS
0.2500 mg | ORAL_TABLET | Freq: Three times a day (TID) | ORAL | Status: DC | PRN
  Administered 2015-07-17 – 2015-07-21 (×3): 0.25 mg via ORAL
  Filled 2015-07-16 (×4): qty 1

## 2015-07-16 NOTE — Progress Notes (Signed)
Pharmacy Antibiotic Note  Joycelyn ManMichael Dines is a 65 y.o. male admitted on 07/12/2015 with diverticulitis with microperforation.  Pharmacy has been consulted for Primaxin dosing. Continue with conservative therapy at this time with imipenem.  Likely surgery if fails current therapy.   Day #4  Plan:  Primaxin 500mg  IV q6hr  Height: 5\' 11"  (180.3 cm) Weight: 180 lb 12.4 oz (82 kg) IBW/kg (Calculated) : 75.3  Temp (24hrs), Avg:99.8 F (37.7 C), Min:98.2 F (36.8 C), Max:102.1 F (38.9 C)   Recent Labs Lab 07/12/15 1217  WBC 18.1*  CREATININE 0.93    Estimated Creatinine Clearance: 86.6 mL/min (by C-G formula based on Cr of 0.93).    Allergies  Allergen Reactions  . Lisinopril Anaphylaxis  . Other     EITHER Bactrim OR Biaxin; pt does not remember which.  Gas and bloating.   Mahi mahi fish caused lips to swell.    Marland Kitchen. Penicillins Swelling and Rash        Antimicrobials this admission: Primaxin 3/31 >>   Microbiology results: none  Thank you for allowing pharmacy to be a part of this patient's care.  Juliette Alcideustin Zeigler, PharmD, BCPS.   Pager: [phone] 07/16/2015 11:28 AM

## 2015-07-16 NOTE — Progress Notes (Signed)
  Subjective: Feels better overall.  Tolerating clear liquids. Less abdominal pain.  Having some urinary urgency.  Objective: Vital signs in last 24 hours: Temp:  [98.2 F (36.8 C)-98.8 F (37.1 C)] 98.2 F (36.8 C) (04/03 0550) Pulse Rate:  [76-94] 76 (04/03 0550) Resp:  [18] 18 (04/03 0550) BP: (123-136)/(76-89) 136/76 mmHg (04/03 0550) SpO2:  [97 %-99 %] 98 % (04/03 0550) Last BM Date: 07/16/15  Intake/Output from previous day: 04/02 0701 - 04/03 0700 In: 5037.9 [P.O.:1540; I.V.:3097.9; IV Piggyback:400] Out: 1900 [Urine:1900] Intake/Output this shift:    PE: General- In NAD Abdomen-soft, mild RLQ and suprapubic tenderness, no guarding  Lab Results:   Recent Labs  07/14/15 0533 07/15/15 0857  WBC 19.6* 10.8*  HGB 12.1* 12.7*  HCT 36.3* 37.1*  PLT 236 265   BMET  Recent Labs  07/14/15 0533 07/15/15 0857  NA 141 139  K 3.7 3.3*  CL 109 108  CO2 24 23  GLUCOSE 127* 106*  BUN 13 9  CREATININE 1.05 0.80  CALCIUM 8.6* 8.8*   PT/INR No results for input(s): LABPROT, INR in the last 72 hours. Comprehensive Metabolic Panel:    Component Value Date/Time   NA 139 07/15/2015 0857   NA 141 07/14/2015 0533   K 3.3* 07/15/2015 0857   K 3.7 07/14/2015 0533   CL 108 07/15/2015 0857   CL 109 07/14/2015 0533   CO2 23 07/15/2015 0857   CO2 24 07/14/2015 0533   BUN 9 07/15/2015 0857   BUN 13 07/14/2015 0533   CREATININE 0.80 07/15/2015 0857   CREATININE 1.05 07/14/2015 0533   GLUCOSE 106* 07/15/2015 0857   GLUCOSE 127* 07/14/2015 0533   CALCIUM 8.8* 07/15/2015 0857   CALCIUM 8.6* 07/14/2015 0533   AST 24 07/12/2015 1217   ALT 25 07/12/2015 1217   ALKPHOS 81 07/12/2015 1217   BILITOT 1.1 07/12/2015 1217   PROT 7.7 07/12/2015 1217   ALBUMIN 4.8 07/12/2015 1217     Studies/Results: No results found.  Anti-infectives: Anti-infectives    Start     Dose/Rate Route Frequency Ordered Stop   07/13/15 0030  imipenem-cilastatin (PRIMAXIN) 500 mg in sodium  chloride 0.9 % 100 mL IVPB     500 mg 200 mL/hr over 30 Minutes Intravenous 4 times per day 07/13/15 0022     07/12/15 2100  ertapenem (INVANZ) 1 g in sodium chloride 0.9 % 50 mL IVPB     1 g 100 mL/hr over 30 Minutes Intravenous  Once 07/12/15 2059 07/12/15 2206      Assessment Active Problems:   Recurrent, complicated sigmoid diverticulitis with microperforation and pelvic fluid collection-clinically better.   Chronic anxiety-takes Xanax at home, prn.     LOS: 4 days   Plan: Advance to full liquid diet.  Continue IV Primaxin.  We had a long discussion about his previous hospitalization in the [place] area, his current process and the plan for his treatment.  Optimally, medical treatment would be successful and then he could have a colonoscopy in 6 weeks and an elective partial colectomy after the colonoscopy.  If the process does not resolve with medical treatment (which may include home IV abxs), he would need a Hartmann procedure.  I spent 45 minutes with him going over everything and answering all of his questions.   Walter Freeman J 07/16/2015

## 2015-07-17 ENCOUNTER — Inpatient Hospital Stay (HOSPITAL_COMMUNITY): Payer: BLUE CROSS/BLUE SHIELD

## 2015-07-17 MED ORDER — PANTOPRAZOLE SODIUM 40 MG PO TBEC
40.0000 mg | DELAYED_RELEASE_TABLET | Freq: Every day | ORAL | Status: DC
  Administered 2015-07-17 – 2015-07-21 (×5): 40 mg via ORAL
  Filled 2015-07-17 (×5): qty 1

## 2015-07-17 MED ORDER — DIPHENHYDRAMINE HCL 25 MG PO CAPS
25.0000 mg | ORAL_CAPSULE | Freq: Every evening | ORAL | Status: DC | PRN
  Administered 2015-07-18: 25 mg via ORAL
  Filled 2015-07-17 (×2): qty 1

## 2015-07-17 MED ORDER — AMLODIPINE BESYLATE 10 MG PO TABS
10.0000 mg | ORAL_TABLET | Freq: Every day | ORAL | Status: DC
  Administered 2015-07-17 – 2015-07-21 (×5): 10 mg via ORAL
  Filled 2015-07-17 (×5): qty 1

## 2015-07-17 NOTE — Progress Notes (Signed)
[facility] Progress Note     Subjective: C/o not being able to sleep last night.  No N/V, tolerating fulls well.  Ambulating OOB.  Having flatus and had a BM yesterday.  Hungry for real food.  No significant distension.  WBC was normal yesterday and afebrile.  He wants to be started back on his blood pressure medications.    Objective: Vital signs in last 24 hours: Temp:  [97.5 F (36.4 C)-98 F (36.7 C)] 97.5 F (36.4 C) (04/04 0631) Pulse Rate:  [73-83] 73 (04/04 0631) Resp:  [18-20] 18 (04/04 0631) BP: (129-151)/(70-89) 144/81 mmHg (04/04 0631) SpO2:  [93 %-100 %] 93 % (04/04 0631) Last BM Date: 07/16/15  Intake/Output from previous day: 04/03 0701 - 04/04 0700 In: 2695 [P.O.:840; I.V.:1455; IV Piggyback:400] Out: 2450 [Urine:2450] Intake/Output this shift: Total I/O In: -  Out: 650 [Urine:650]  PE: Gen:  Alert, NAD, pleasant Card:  RRR, no M/G/R heard Pulm:  CTA, no W/R/R Abd: Soft, mild distension, NT, +BS, no HSM   Lab Results:   Recent Labs  07/15/15 0857 07/16/15 1039  WBC 10.8* 7.3  HGB 12.7* 12.6*  HCT 37.1* 36.1*  PLT 265 292   BMET  Recent Labs  07/15/15 0857  NA 139  K 3.3*  CL 108  CO2 23  GLUCOSE 106*  BUN 9  CREATININE 0.80  CALCIUM 8.8*   PT/INR No results for input(s): LABPROT, INR in the last 72 hours. CMP     Component Value Date/Time   NA 139 07/15/2015 0857   K 3.3* 07/15/2015 0857   CL 108 07/15/2015 0857   CO2 23 07/15/2015 0857   GLUCOSE 106* 07/15/2015 0857   BUN 9 07/15/2015 0857   CREATININE 0.80 07/15/2015 0857   CALCIUM 8.8* 07/15/2015 0857   PROT 7.7 07/12/2015 1217   ALBUMIN 4.8 07/12/2015 1217   AST 24 07/12/2015 1217   ALT 25 07/12/2015 1217   ALKPHOS 81 07/12/2015 1217   BILITOT 1.1 07/12/2015 1217   GFRNONAA >60 07/15/2015 0857   GFRAA >60 07/15/2015 0857   Lipase     Component Value Date/Time   LIPASE 34 07/12/2015 1217       Studies/Results: No results  found.  Anti-infectives: Anti-infectives    Start     Dose/Rate Route Frequency Ordered Stop   07/13/15 0030  imipenem-cilastatin (PRIMAXIN) 500 mg in sodium chloride 0.9 % 100 mL IVPB     500 mg 200 mL/hr over 30 Minutes Intravenous 4 times per day 07/13/15 0022     07/12/15 2100  ertapenem (INVANZ) 1 g in sodium chloride 0.9 % 50 mL IVPB     1 g 100 mL/hr over 30 Minutes Intravenous  Once 07/12/15 2059 07/12/15 2206       Assessment/Plan Recurrent complicated perforated sigmoid diverticulitis with microperforation and pelvic fluid collection S/p [facility] -Pain is nearly resolved, advance to soft diet, he was tolerating fulls well. -Continue medical management with IV antibiotics.  Plan on repeat CT scan tomorrow or Thursday since last one was on 07/12/15. -Plan for colonoscopy in 6 weeks and an elective partial colectomy after the colonoscopy -If the process does not resolve with medical treatment (which may include home IV abxs), he would need a Hartmann procedure. ID-Primaxin D#5 FEN-Soft diet, IVF VTE prophylaxis-SCD/lovenox Dispo-continue inaptient     LOS: 5 days    Nonie HoyerMegan N Ardell Makarewicz 07/17/2015, 11:38 AM Pager: [phone]  (7am - 4:30pm M-F; 7am -  11:30am Sa/Su)

## 2015-07-17 NOTE — Progress Notes (Signed)
PHARMACIST - PHYSICIAN COMMUNICATION  CONCERNING: IV to Oral Route Change Policy  RECOMMENDATION: This patient is receiving pantoprazole by the intravenous route.  Based on criteria approved by the Pharmacy and Therapeutics Committee, the intravenous medication(s) is/are being converted to the equivalent oral dose form(s).   DESCRIPTION: These criteria include:  The patient is eating (either orally or via tube) and/or has been taking other orally administered medications for a least 24 hours  The patient has no evidence of active gastrointestinal bleeding or impaired GI absorption (gastrectomy, short bowel, patient on TNA or NPO).  If you have questions about this conversion, please contact the Pharmacy Department  []   [phone]( [phone] )  [facility] []   [phone]( [phone] )  [facility] []   [phone]( [phone] )  [facility] []   [phone]( [phone] )  [facility] [x]   [phone]( [phone] )  [facility]   Grace IsaacYuhong  Peace Noyes, PharmD candidate 07/17/2015 7:45 AM

## 2015-07-17 NOTE — Progress Notes (Signed)
Pt refused Abdominal CT scan for this evening. Pt reports provider stating test would be completed in am. Spoke to on-call provider and CT will be completed in am.

## 2015-07-17 NOTE — Progress Notes (Addendum)
Nutrition Follow-up  DOCUMENTATION CODES:   Not applicable  INTERVENTION:  - Continue Soft diet - RD will continue to monitor for needs, pt questions and concerns related to nutrition  NUTRITION DIAGNOSIS:   Inadequate oral intake related to inability to eat as evidenced by NPO status. -resolving with diet advancement  GOAL:   Patient will meet greater than or equal to 90% of their needs -unmet  MONITOR:   Weight trends, Labs, Skin, I & O's  ASSESSMENT:   [age] yom who is fairly healthy with psh of IH presents with abdominal pain. He has history of colonoscopy about 10-12 years ago. He was admitted for at least 2 weeks he states to hospital in [place] for diverticular disease. He got better slowly but sounds like he was on some abx and antifungals. He also eventually went for laparoscopic washout and drain placement. He was discharged it sounds like without drain about three days later. He has done pretty well since then and has moved to this area. Yesterday night he had acute onset abdominal pain. He was able to sleep after using xanax and vicodin as directed by physician. He then progressed today and there was no relief. He is passing flatus.  4/4 Diet advancement as follows: 3/31 @ 0003: NPO 4/2 @ 1229: CLD 4/3 @ 0957: FLD 4/4 @ 1137: Soft diet  Pt reports tolerating liquid diets without N/V or exacerbation of abdominal pain. Pt received lunch tray during discussion with RD and states that all foods have tasted "off" in some way and that food smells have been altered as well. Pt reports that he has been off of pain medications for several days. Pt continues to receive IV antibiotics which may be causing taste alteration; will continue to monitor taste sensations.   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 Pt states resolution of IBS/IBS symptoms since drain placement in January 2017 and beginning  taking Xanax to aid with his anxiety.  Pt with several questions concerning nutrition/nutrition-recommendations and reports frustration with conflicting diet recommendations from multiple health care providers. Talked with pt about IBS and avoiding items that are irritating to him as research evidence currently does not provide a standard recommendation for IBS patients with all patients having differing severity and irritating factors. Talked with pt about current research showing evidence that nuts and seeds do not need to be avoided for patients with diverticulosis even during times of flares and inflammation (diverticulitis).   After drain placement in January pt began slowly adding fiber back into his diet and was drinking at least 1/2-1 gallon of fluids/day. He states that he was doing very well on this diet and having several BMs/day which he was pleased with as he had often experienced difficulty with bowel habits in the past. Pt states that a few health care providers had previously recommended low fiber diet even during times of diverticulosis without inflammation. Encouraged pt to consume low fiber diet (as currently ordered) with diverticulitis and to slowly add fiber back into diet to reach a high fiber diet, as he had previously done, once acute inflammation is resolved. High fiber diet for diverticulosis is evidence-based and pt reports this regimen was working very well for him and that he had increased transit time with high fiber diet compared to low fiber diet.  Pt very knowledgeable about food and what constitutes high and low fiber and does not require education on this topic. Pt  states that after surgery in January he had been performing strenuous physical activity related to going to the gym as well as moving from previous residence to an apartment with 3 flights of steps in the [place] area.  Pt currently not meeting needs. IVF: NS @ 50 mL/hr. Medications reviewed; 500 mg IV  Primaxin every 6 hours. Labs reviewed; K: 3.3 mmol/L, Ca: 8.8 mg/dL.    ADDENDUM: Per surgical PA note this AM at 1138: -Continue medical management with IV antibiotics. Plan on repeat CT scan tomorrow or Thursday since last one was on 07/12/15. -Plan for colonoscopy in 6 weeks and an elective partial colectomy after the colonoscopy -If the process does not resolve with medical treatment (which may include home IV abxs), he would need a Hartmann procedure.   3/31 - Pt has been NPO since admission and unable to meet needs.  - Pt sleeping soundly, snoring at time of visit and does not arouse to name call x3.  - No other individuals in pt's room to provide information from PTA.  - Per chart review, pt with hx of diverticular disease/diverticulitis.  - Notes also indicate that pt had diverticulitis with microperforation in January 2017 and was in the hospital for 3 weeks related to this event; note indicates that since that time pt reports no issues with eating or bowel habits.  - Per surgery PA note at 0852 this AM, pt may require Hartmann's.  - Note also states plan to discuss abscess drainage with IR although doubtful this will be feasible.   Diet Order:  DIET SOFT Room service appropriate?: Yes; Fluid consistency:: Thin  Skin:  Reviewed, no issues  Last BM:  4/3  Height:   Ht Readings from Last 1 Encounters:  07/13/15  (1.803 m)    Weight:   Wt Readings from Last 1 Encounters:  07/13/15 180 lb 12.4 oz (82 kg)    Ideal Body Weight:  78.18 kg (kg)  BMI:  Body mass index is 25.22 kg/(m^2).  Estimated Nutritional Needs:   Kcal:  2050-2215 (25-27 kcal/kg)  Protein:  82-98 grams (1-1.2 grams/kg)  Fluid:  >/2 L/day  EDUCATION NEEDS:   No education needs identified at this time     Trenton Gammon, RD, LDN Inpatient Clinical Dietitian Pager # [phone] After hours/weekend pager # [phone]

## 2015-07-18 ENCOUNTER — Encounter (HOSPITAL_COMMUNITY): Payer: Self-pay | Admitting: Radiology

## 2015-07-18 ENCOUNTER — Inpatient Hospital Stay (HOSPITAL_COMMUNITY): Payer: BLUE CROSS/BLUE SHIELD

## 2015-07-18 LAB — BASIC METABOLIC PANEL
Anion gap: 11 (ref 5–15)
BUN: 7 mg/dL (ref 6–20)
CO2: 24 mmol/L (ref 22–32)
Calcium: 9.4 mg/dL (ref 8.9–10.3)
Chloride: 108 mmol/L (ref 101–111)
Creatinine, Ser: 0.91 mg/dL (ref 0.61–1.24)
GFR calc Af Amer: >60 mL/min (ref >60)
GFR calc non Af Amer: >60 mL/min (ref >60)
Glucose, Bld: 113 mg/dL — ABNORMAL HIGH (ref 65–99)
Potassium: 3.5 mmol/L (ref 3.5–5.1)
Sodium: 143 mmol/L (ref 135–145)

## 2015-07-18 MED ORDER — IOPAMIDOL (ISOVUE-300) INJECTION 61%
100.0000 mL | Freq: Once | INTRAVENOUS | Status: AC | PRN
  Administered 2015-07-18: 100 mL via INTRAVENOUS

## 2015-07-18 MED ORDER — IOHEXOL 300 MG/ML  SOLN: 25.0000 mL | INTRAMUSCULAR | Status: AC

## 2015-07-18 NOTE — Progress Notes (Signed)
Subjective: Still has a little urinary urgency.  No abdominal pain.  Tolerating diet.  Bowels moving.  [facility].  He had laparoscopic lavage and drain placed.  Drain removed POD #4 and he was discharged.  Objective: Vital signs in last 24 hours: Temp:  [97.7 F (36.5 C)-98.2 F (36.8 C)] 97.8 F (36.6 C) (04/05 1324) Pulse Rate:  [59-78] 76 (04/05 1324) Resp:  [18-20] 18 (04/05 1324) BP: (126-139)/(74-93) 126/79 mmHg (04/05 1324) SpO2:  [96 %-99 %] 97 % (04/05 1324) Last BM Date: 07/18/15  Intake/Output from previous day: 04/04 0701 - 04/05 0700 In: 1730 [P.O.:120; I.V.:1210; IV Piggyback:400] Out: 1800 [Urine:1800] Intake/Output this shift: Total I/O In: 1240 [P.O.:840; I.V.:400] Out: 1225 [Urine:1225]  PE: General- In NAD Abdomen-soft, no tenderness Lab Results:   Recent Labs  07/16/15 1039  WBC 7.3  HGB 12.6*  HCT 36.1*  PLT 292   BMET No results for input(s): NA, K, CL, CO2, GLUCOSE, BUN, CREATININE, CALCIUM in the last 72 hours. PT/INR No results for input(s): LABPROT, INR in the last 72 hours. Comprehensive Metabolic Panel:    Component Value Date/Time   NA 139 07/15/2015 0857   NA 141 07/14/2015 0533   K 3.3* 07/15/2015 0857   K 3.7 07/14/2015 0533   CL 108 07/15/2015 0857   CL 109 07/14/2015 0533   CO2 23 07/15/2015 0857   CO2 24 07/14/2015 0533   BUN 9 07/15/2015 0857   BUN 13 07/14/2015 0533   CREATININE 0.80 07/15/2015 0857   CREATININE 1.05 07/14/2015 0533   GLUCOSE 106* 07/15/2015 0857   GLUCOSE 127* 07/14/2015 0533   CALCIUM 8.8* 07/15/2015 0857   CALCIUM 8.6* 07/14/2015 0533   AST 24 07/12/2015 1217   ALT 25 07/12/2015 1217   ALKPHOS 81 07/12/2015 1217   BILITOT 1.1 07/12/2015 1217   PROT 7.7 07/12/2015 1217   ALBUMIN 4.8 07/12/2015 1217     Studies/Results: Ct Abdomen Pelvis W Contrast  07/18/2015  CLINICAL DATA:  Followup diverticulitis EXAM: CT ABDOMEN AND PELVIS  WITH CONTRAST TECHNIQUE: Multidetector CT imaging of the abdomen and pelvis was performed using the standard protocol following bolus administration of intravenous contrast. CONTRAST:  100mL ISOVUE-300 IOPAMIDOL (ISOVUE-300) INJECTION 61% COMPARISON:  07/12/2015 FINDINGS: Lower chest: Small bilateral pleural effusions identified. Nonspecific pneumonitis is identified within both lower lobes. Hepatobiliary: No focal liver abnormality. Sludge and/or small stones noted within the gallbladder. No biliary dilatation. Pancreas: Unremarkable appearance of the pancreas. Spleen: Normal in size. Adrenals/Urinary Tract: The adrenal glands are unremarkable. Bilateral renal cysts are noted. Within the upper pole of the left kidney there is an intermediate attenuating lesion which measures 1.3 cm and 38 HU. Urinary bladder appears normal. Stomach/Bowel: The stomach is normal. The small bowel loops have a normal course and caliber. No bowel obstruction. No pathologic dilatation of the colon. Wall thickening of the sigmoid colon is again noted compatible with the clinical history of perforated diverticulitis. Inflammation within the surrounding soft tissues is noted. There is associated wall thickening involving the right lower quadrant small bowel loops. Contained perforation is again noted. This contains gas and fluid measuring 5.3 x 2.7 x 3.6 cm, image 72 of series 2 and image 58 of series 602. On the previous exam phlegmon within this area measured roughly 2.4 x 1.5 x 4.9 cm. Vascular/Lymphatic: Calcified atherosclerotic disease involves the abdominal aorta. No aneurysm. No enlarged retroperitoneal or mesenteric adenopathy. No enlarged pelvic or inguinal  lymph nodes. Reproductive: Prostate gland and seminal vesicles appear normal Other: As above. Musculoskeletal: No aggressive lytic or sclerotic bone lesion identified IMPRESSION: 1. Again noted is sequelae of perforated diverticulitis involving the sigmoid colon. 2. Gas and  fluid collection is identified within the pelvis adjacent to the sigmoid colon and small bowel loops measuring 5.3 x 2.7 x 3.6 cm. This appears larger, more well-defined with increased gas and fluid. Electronically Signed   By: Signa Kell M.D.   On: 07/18/2015 10:29    Anti-infectives: Anti-infectives    Start     Dose/Rate Route Frequency Ordered Stop   07/13/15 0030  imipenem-cilastatin (PRIMAXIN) 500 mg in sodium chloride 0.9 % 100 mL IVPB     500 mg 200 mL/hr over 30 Minutes Intravenous 4 times per day 07/13/15 0022     07/12/15 2100  ertapenem (INVANZ) 1 g in sodium chloride 0.9 % 50 mL IVPB     1 g 100 mL/hr over 30 Minutes Intravenous  Once 07/12/15 2059 07/12/15 2206      Assessment Active Problems:   Recurrent, complicated sigmoid diverticulitis with microperforation and pelvic fluid collection-clinically ok but CT a little worse..   Chronic anxiety-takes Xanax at home, prn.     LOS: 6 days   Plan: Consult IR to see if percutaneous drain can be placed.  If not, we talked about repeat laparoscopic lavage and leaving drain in, with postop drain study or possible Hartmann procedure in lavage not able to be done.   Herny Scurlock J 07/18/2015

## 2015-07-18 NOTE — Consult Note (Signed)
Chief Complaint: Patient was seen in consultation today for CT guided pelvic abscess drainage Chief Complaint  Patient presents with  . Abdominal Pain    Referring Physician(s): Rosenbower,Todd  Supervising Physician: Ruel Favors  History of Present Illness: Walter Freeman is a 65 y.o. male with history of GERD, hypertension, irritable bowel syndrome and diverticulitis. He is status post laparoscopic washout procedure and drain placement for perforated diverticulitis in January of this year in [place]. His  abdominal drain was removed 4 days postop.He presented to [facility] on 3/30 with abdominal pain and subsequent CT revealed findings consistent with recurrent perforated diverticulitis with complex fluid collection in the midline pelvis. He was evaluated by surgery and treated conservatively with antibiotic therapy. At the time there was no window for percutaneous drainage of the abscess. Follow-up CT of abdomen and pelvis today has again revealed sequelae of perforated diverticulitis involving the sigmoid colon with a gas and fluid collection in the pelvis adjacent to the sigmoid colon and small bowel loops, slightly larger than on previous scan. Request now received for CT guided percutaneous drainage of the pelvic abscess. Past Medical History  Diagnosis Date  . Diverticulitis   . GERD (gastroesophageal reflux disease)   . Hypertension   . IBS (irritable bowel syndrome)     Past Surgical History  Procedure Laterality Date  . Abdominal surgery      Allergies: Lisinopril; Other; and Penicillins  Medications: Prior to Admission medications   Medication Sig Start Date End Date Taking? Authorizing Provider  ALPRAZolam (XANAX) 0.25 MG tablet Take 0.25 mg by mouth 4 (four) times daily as needed for anxiety.   Yes Historical Provider, MD  amLODipine (NORVASC) 10 MG tablet Take 10 mg by mouth daily.   Yes Historical Provider, MD  HYDROcodone-acetaminophen  (NORCO/VICODIN) 5-325 MG tablet Take 1-2 tablets by mouth every 4 (four) hours as needed (pain.).   Yes Historical Provider, MD  omeprazole (PRILOSEC) 20 MG capsule Take 20 mg by mouth daily.   Yes Historical Provider, MD     History reviewed. No pertinent family history.  Social History   Social History  . Marital Status: Single    Spouse Name: N/A  . Number of Children: N/A  . Years of Education: N/A   Social History Main Topics  . Smoking status: Never Smoker   . Smokeless tobacco: None  . Alcohol Use: No  . Drug Use: None  . Sexual Activity: Not Asked   Other Topics Concern  . None   Social History Narrative      Review of Systems patient currently denies fever,headache, chest pain, dyspnea, significant abdominal pain, back pain, nausea vomiting or abnormal bleeding. He is anxious and concerned about potential need for surgery.  Vital Signs: BP 126/79 mmHg  Pulse 76  Temp(Src) 97.8 F (36.6 C) (Oral)  Resp 18  Ht  (1.803 m)  Wt 180 lb 12.4 oz (82 kg)  BMI 25.22 kg/m2  SpO2 97%  Physical Exam patient awake, alert. Chest with slightly diminished breath sounds at bases.  Heart with regular rate and rhythm and soft systolic murmur.  Abdomen soft, positive bowel sounds, nontender.  Lower extremities with no significant edema and full range of motion.  Mallampati Score:     Imaging: Ct Abdomen Pelvis W Contrast  07/18/2015  CLINICAL DATA:  Followup diverticulitis EXAM: CT ABDOMEN AND PELVIS WITH CONTRAST TECHNIQUE: Multidetector CT imaging of the abdomen and pelvis was performed using the standard protocol following  bolus administration of intravenous contrast. CONTRAST:  ISOVUE-300 IOPAMIDOL (ISOVUE-300) INJECTION 61% COMPARISON:  07/12/2015 FINDINGS: Lower chest: Small bilateral pleural effusions identified. Nonspecific pneumonitis is identified within both lower lobes. Hepatobiliary: No focal liver abnormality. Sludge and/or small stones noted within the  gallbladder. No biliary dilatation. Pancreas: Unremarkable appearance of the pancreas. Spleen: Normal in size. Adrenals/Urinary Tract: The adrenal glands are unremarkable. Bilateral renal cysts are noted. Within the upper pole of the left kidney there is an intermediate attenuating lesion which measures 1.3 cm and 38 HU. Urinary bladder appears normal. Stomach/Bowel: The stomach is normal. The small bowel loops have a normal course and caliber. No bowel obstruction. No pathologic dilatation of the colon. Wall thickening of the sigmoid colon is again noted compatible with the clinical history of perforated diverticulitis. Inflammation within the surrounding soft tissues is noted. There is associated wall thickening involving the right lower quadrant small bowel loops. Contained perforation is again noted. This contains gas and fluid measuring 5.3 x 2.7 x 3.6 cm, image 72 of series 2 and image 58 of series 602. On the previous exam phlegmon within this area measured roughly 2.4 x 1.5 x 4.9 cm. Vascular/Lymphatic: Calcified atherosclerotic disease involves the abdominal aorta. No aneurysm. No enlarged retroperitoneal or mesenteric adenopathy. No enlarged pelvic or inguinal lymph nodes. Reproductive: Prostate gland and seminal vesicles appear normal Other: As above. Musculoskeletal: No aggressive lytic or sclerotic bone lesion identified IMPRESSION: 1. Again noted is sequelae of perforated diverticulitis involving the sigmoid colon. 2. Gas and fluid collection is identified within the pelvis adjacent to the sigmoid colon and small bowel loops measuring 5.3 x 2.7 x 3.6 cm. This appears larger, more well-defined with increased gas and fluid. Electronically Signed   By: Signa Kell M.D.   On: 07/18/2015 10:29   Ct Abdomen Pelvis W Contrast  07/12/2015  CLINICAL DATA:  Abdominal pain starting last night. History of diverticulitis. EXAM: CT ABDOMEN AND PELVIS WITH CONTRAST TECHNIQUE: Multidetector CT imaging of the  abdomen and pelvis was performed using the standard protocol following bolus administration of intravenous contrast. CONTRAST:  ISOVUE-300 IOPAMIDOL (ISOVUE-300) INJECTION 61% COMPARISON:  None. FINDINGS: Lower chest:  Mild bibasilar atelectasis. Hepatobiliary: Liver is low in density suggesting fatty infiltration. No focal mass or lesion within the liver. Gallbladder is mildly distended but otherwise unremarkable. No evidence of cholecystitis. No bile duct dilatation. Pancreas: No mass, inflammatory changes, or other significant abnormality. Spleen: Within normal limits in size and appearance. Adrenals/Urinary Tract: No masses identified. No evidence of hydronephrosis. Stomach/Bowel: There is thickening of the walls of the mid sigmoid colon, and paracolic fluid stranding noted about this segment of the sigmoid colon, with scattered diverticulosis throughout, consistent with acute diverticulitis. Ill-defined collection of fluid and extraluminal air noted within the central pelvis, suspicious for early developing abscess collection which measures approximately 2.3 cm greatest thickness and 4.9 cm craniocaudal dimension, indicating an adjacent perforated diverticulitis. Distal small bowel abuts this extraluminal collection, with associated thickening/inflammation of the walls of the small bowel, and possible associated fistulous connection. Remainder of the bowel appears normal in caliber and configuration. No other site of bowel wall thickening or inflammation. Appendix is normal. Free intraperitoneal air noted within the upper abdomen, again indicating bowel perforation. Vascular/Lymphatic: Scattered atherosclerotic changes of the normal- caliber abdominal aorta. No acute - appearing vascular abnormality. Reproductive: No mass or other significant abnormality. Other: Small amount of additional free fluid in the lower pelvis. Free intraperitoneal air within the upper abdomen, as above.  Musculoskeletal: No acute  or suspicious osseous lesion. Mild degenerative change within the lumbar spine. 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 IMPRESSION: 1. Thickening of the walls of the mid sigmoid colon, with associated paracolic fluid stranding, consistent with acute diverticulitis. Free intraperitoneal air indicates associated bowel perforation (perforated diverticulitis). 2. Thin collection of fluid and extraluminal air within the midline pelvis, measuring 2.3 cm greatest thickness and approximately 4.9 cm craniocaudal dimension, located immediately adjacent to the segment of sigmoid colon diverticulitis, is highly suggestive of early developing abscess. 3. The complex collection in the midline pelvis, suspected to be early abscess formation, also abuts the adjacent distal small bowel, with associated thickening/inflammation of the walls of these small bowel loops which could be reactive thickening or indicate abscess involvement and/or fistulous connection. 4. As above, there is free intraperitoneal air within the upper abdomen, indicating bowel perforation (perforated sigmoid diverticulitis). Critical Value/emergent results were called by telephone at the time of interpretation on 07/12/2015 at 8:03 pm to Dr. Charlestine NightHRISTOPHER LAWYER , who verbally acknowledged these results. Electronically Signed   By: Bary RichardStan  Maynard M.D.   On: 07/12/2015 20:11    Labs:  CBC:  Recent Labs  07/13/15 0505 07/14/15 0533 07/15/15 0857 07/16/15 1039  WBC 19.1* 19.6* 10.8* 7.3  HGB 13.1 12.1* 12.7* 12.6*  HCT 39.3 36.3* 37.1* 36.1*  PLT 273 236 265 292    COAGS: No results for input(s): INR, APTT in the last 8760 hours.  BMP:  Recent Labs  07/12/15 1217 07/13/15 0505 07/14/15 0533 07/15/15 0857  NA 138 141 141 139  K 3.9 4.0 3.7 3.3*  CL 104 107 109 108  CO2 24 24 24 23   GLUCOSE 144* 138* 127* 106*  BUN 12 12 13 9   CALCIUM 9.7 8.7* 8.6* 8.8*    CREATININE 0.93 0.94 1.05 0.80  GFRNONAA >60 >60 >60 >60  GFRAA >60 >60 >60 >60    LIVER FUNCTION TESTS:  Recent Labs  07/12/15 1217  BILITOT 1.1  AST 24  ALT 25  ALKPHOS 81  PROT 7.7  ALBUMIN 4.8    TUMOR MARKERS: No results for input(s): AFPTM, CEA, CA199, CHROMGRNA in the last 8760 hours.  Assessment and Plan: Patient with recurrent, complicated sigmoid diverticulitis with microperforation and pelvic fluid collection/abscess; Request now received for CT guided drainage of the abscess. Imaging studies have been reviewed by Dr. Loreta AveWagner. On today's CT scan there appears to be a small window for percutaneous drainage of the abscess. Pt has eaten and received lovenox today, therefore procedure will be done tent on 4/6. If adequate percutaneous window is not present on tomorrow's CT scan, pt understands drain may not be placed. Details/risks of procedure, including but not limited to, internal bleeding, infection, injury to adjacent structures, need for surgery discussed with patient with his understanding and consent.   Thank you for this interesting consult.  I greatly enjoyed meeting Walter Freeman and look forward to participating in their care.  A copy of this report was sent to the requesting provider on this date.  Electronically Signed: D. Jeananne RamaKevin Allred 07/18/2015, 3:22 PM   I spent a total of 40 minutes in face to face in clinical consultation, greater than 50% of which was counseling/coordinating care for CT guided pelvic abscess drainage

## 2015-07-18 NOTE — Plan of Care (Signed)
Problem: Education: Goal: Knowledge of [facility] Education information/materials will improve Outcome: Completed/Met Date Met:  07/18/15 Continued education provided regarding diagnosis and treatment. Discussed current medications benefits and side effects

## 2015-07-18 NOTE — Plan of Care (Signed)
Problem: Pain Managment: Goal: General experience of comfort will improve Outcome: Completed/Met Date Met:  07/18/15 Patient remains pain free x 2days. No requests have been made for medications

## 2015-07-19 ENCOUNTER — Inpatient Hospital Stay (HOSPITAL_COMMUNITY): Payer: BLUE CROSS/BLUE SHIELD

## 2015-07-19 LAB — CBC
HCT: 36.7 % — ABNORMAL LOW (ref 39.0–52.0)
Hemoglobin: 12.8 g/dL — ABNORMAL LOW (ref 13.0–17.0)
MCH: 30 pg (ref 26.0–34.0)
MCHC: 34.9 g/dL (ref 30.0–36.0)
MCV: 86.2 fL (ref 78.0–100.0)
Platelets: 352 10*3/uL (ref 150–400)
RBC: 4.26 MIL/uL (ref 4.22–5.81)
RDW: 13.1 % (ref 11.5–15.5)
WBC: 8.4 10*3/uL (ref 4.0–10.5)

## 2015-07-19 LAB — PROTIME-INR
INR: 1.15 (ref 0.00–1.49)
Prothrombin Time: 14.4 seconds (ref 11.6–15.2)

## 2015-07-19 MED ORDER — FENTANYL CITRATE (PF) 100 MCG/2ML IJ SOLN
INTRAMUSCULAR | Status: AC | PRN
  Administered 2015-07-19: 50 ug via INTRAVENOUS
  Administered 2015-07-19 (×2): 25 ug via INTRAVENOUS

## 2015-07-19 MED ORDER — MIDAZOLAM HCL 2 MG/2ML IJ SOLN
INTRAMUSCULAR | Status: AC
  Filled 2015-07-19: qty 6

## 2015-07-19 MED ORDER — FENTANYL CITRATE (PF) 100 MCG/2ML IJ SOLN
INTRAMUSCULAR | Status: AC
  Filled 2015-07-19: qty 4

## 2015-07-19 MED ORDER — MIDAZOLAM HCL 2 MG/2ML IJ SOLN
INTRAMUSCULAR | Status: AC | PRN
  Administered 2015-07-19 (×4): 1 mg via INTRAVENOUS

## 2015-07-19 NOTE — Progress Notes (Signed)
Pt converted from 1st degree heart block to normal sinus rhythm

## 2015-07-19 NOTE — Sedation Documentation (Signed)
Patient denies pain and is resting comfortably.  

## 2015-07-19 NOTE — Sedation Documentation (Signed)
Dr. Miles CostainShick available. Pt assisted back on CT table in anticipation of drain placement.

## 2015-07-19 NOTE — Procedures (Signed)
Successful CT guided pelvic diverticular abscess drain insertion No comp Stable 5cc pus aspirated GS/CX sent Full report in PACS

## 2015-07-19 NOTE — Care Management Note (Signed)
Case Management Note  Patient Details  Name: Walter Freeman MRN: [id number] Date of Birth: [birth year]  Subjective/Objective:  IR eval for Percutaneous abscess drain. From home.                  Action/Plan:d/c plan home.   Expected Discharge Date:                  Expected Discharge Plan:  Home/Self Care  In-House Referral:     Discharge planning Services  CM Consult  Post Acute Care Choice:    Choice offered to:     DME Arranged:    DME Agency:     HH Arranged:    HH Agency:     Status of Service:  In process, will continue to follow  Medicare Important Message Given:    Date Medicare IM Given:    Medicare IM give by:    Date Additional Medicare IM Given:    Additional Medicare Important Message give by:     If discussed at Long Length of Stay Meetings, dates discussed:    Additional Comments:  Lanier ClamMahabir, Valorie Mcgrory, RN 07/19/2015, 2:55 PM

## 2015-07-19 NOTE — Sedation Documentation (Addendum)
Pt taken off CT table and assisted back to his bed. Procedure delayed d/t IR loading a patient in their room. Dr. Miles CostainShick is now unavailable.

## 2015-07-19 NOTE — Progress Notes (Signed)
Subjective: No complaints. Had a BM.  Objective: Vital signs in last 24 hours: Temp:  [97.8 F (36.6 C)-98.1 F (36.7 C)] 98 F (36.7 C) (04/06 0403) Pulse Rate:  [72-85] 72 (04/06 0403) Resp:  [16-18] 16 (04/06 0403) BP: (125-134)/(68-79) 134/78 mmHg (04/06 0956) SpO2:  [97 %-99 %] 97 % (04/06 0403) Last BM Date: 07/19/15  Intake/Output from previous day: 04/05 0701 - 04/06 0700 In: 2199.2 [P.O.:840; I.V.:1159.2; IV Piggyback:200] Out: 2250 [Urine:2250] Intake/Output this shift: Total I/O In: -  Out: 500 [Urine:500]  PE: General- In NAD Abdomen-soft, no tenderness Lab Results:   Recent Labs  07/19/15 0506  WBC 8.4  HGB 12.8*  HCT 36.7*  PLT 352   BMET  Recent Labs  07/18/15 1625  NA 143  K 3.5  CL 108  CO2 24  GLUCOSE 113*  BUN 7  CREATININE 0.91  CALCIUM 9.4   PT/INR  Recent Labs  07/19/15 0506  LABPROT 14.4  INR 1.15   Comprehensive Metabolic Panel:    Component Value Date/Time   NA 143 07/18/2015 1625   NA 139 07/15/2015 0857   K 3.5 07/18/2015 1625   K 3.3* 07/15/2015 0857   CL 108 07/18/2015 1625   CL 108 07/15/2015 0857   CO2 24 07/18/2015 1625   CO2 23 07/15/2015 0857   BUN 7 07/18/2015 1625   BUN 9 07/15/2015 0857   CREATININE 0.91 07/18/2015 1625   CREATININE 0.80 07/15/2015 0857   GLUCOSE 113* 07/18/2015 1625   GLUCOSE 106* 07/15/2015 0857   CALCIUM 9.4 07/18/2015 1625   CALCIUM 8.8* 07/15/2015 0857   AST 24 07/12/2015 1217   ALT 25 07/12/2015 1217   ALKPHOS 81 07/12/2015 1217   BILITOT 1.1 07/12/2015 1217   PROT 7.7 07/12/2015 1217   ALBUMIN 4.8 07/12/2015 1217     Studies/Results: Ct Abdomen Pelvis W Contrast  07/18/2015  CLINICAL DATA:  Followup diverticulitis EXAM: CT ABDOMEN AND PELVIS WITH CONTRAST TECHNIQUE: Multidetector CT imaging of the abdomen and pelvis was performed using the standard protocol following bolus administration of intravenous contrast. CONTRAST:  100mL ISOVUE-300 IOPAMIDOL (ISOVUE-300)  INJECTION 61% COMPARISON:  07/12/2015 FINDINGS: Lower chest: Small bilateral pleural effusions identified. Nonspecific pneumonitis is identified within both lower lobes. Hepatobiliary: No focal liver abnormality. Sludge and/or small stones noted within the gallbladder. No biliary dilatation. Pancreas: Unremarkable appearance of the pancreas. Spleen: Normal in size. Adrenals/Urinary Tract: The adrenal glands are unremarkable. Bilateral renal cysts are noted. Within the upper pole of the left kidney there is an intermediate attenuating lesion which measures 1.3 cm and 38 HU. Urinary bladder appears normal. Stomach/Bowel: The stomach is normal. The small bowel loops have a normal course and caliber. No bowel obstruction. No pathologic dilatation of the colon. Wall thickening of the sigmoid colon is again noted compatible with the clinical history of perforated diverticulitis. Inflammation within the surrounding soft tissues is noted. There is associated wall thickening involving the right lower quadrant small bowel loops. Contained perforation is again noted. This contains gas and fluid measuring 5.3 x 2.7 x 3.6 cm, image 72 of series 2 and image 58 of series 602. On the previous exam phlegmon within this area measured roughly 2.4 x 1.5 x 4.9 cm. Vascular/Lymphatic: Calcified atherosclerotic disease involves the abdominal aorta. No aneurysm. No enlarged retroperitoneal or mesenteric adenopathy. No enlarged pelvic or inguinal lymph nodes. Reproductive: Prostate gland and seminal vesicles appear normal Other: As above. Musculoskeletal: No aggressive lytic or sclerotic bone lesion identified IMPRESSION: 1. Again  noted is sequelae of perforated diverticulitis involving the sigmoid colon. 2. Gas and fluid collection is identified within the pelvis adjacent to the sigmoid colon and small bowel loops measuring 5.3 x 2.7 x 3.6 cm. This appears larger, more well-defined with increased gas and fluid. Electronically Signed   By:  Signa Kell M.D.   On: 07/18/2015 10:29    Anti-infectives: Anti-infectives    Start     Dose/Rate Route Frequency Ordered Stop   07/13/15 0030  imipenem-cilastatin (PRIMAXIN) 500 mg in sodium chloride 0.9 % 100 mL IVPB     500 mg 200 mL/hr over 30 Minutes Intravenous 4 times per day 07/13/15 0022     07/12/15 2100  ertapenem (INVANZ) 1 g in sodium chloride 0.9 % 50 mL IVPB     1 g 100 mL/hr over 30 Minutes Intravenous  Once 07/12/15 2059 07/12/15 2206      Assessment Active Problems:   Recurrent, complicated sigmoid diverticulitis with microperforation and pelvic fluid collection-clinically ok but CT on 07/18/15 was a little worse..   Chronic anxiety-takes Xanax at home, prn.     LOS: 7 days   Plan: Attempt percutaneous drainage today.  If unsuccessful, will try laparoscopic lavage and drainage tomorrow with possible Hartmann procedure.  Walter Freeman J 07/19/2015

## 2015-07-20 LAB — CREATININE, SERUM
Creatinine, Ser: 0.9 mg/dL (ref 0.61–1.24)
GFR calc Af Amer: >60 mL/min (ref >60)
GFR calc non Af Amer: >60 mL/min (ref >60)

## 2015-07-20 MED ORDER — SODIUM CHLORIDE 0.9 % IV SOLN
1.0000 g | INTRAVENOUS | Status: DC
  Administered 2015-07-20 – 2015-07-21 (×2): 1 g via INTRAVENOUS
  Filled 2015-07-20 (×2): qty 1

## 2015-07-20 MED ORDER — PRO-STAT SUGAR FREE PO LIQD
30.0000 mL | Freq: Every day | ORAL | Status: DC
  Administered 2015-07-20: 30 mL via ORAL
  Filled 2015-07-20 (×2): qty 30

## 2015-07-20 MED ORDER — SODIUM CHLORIDE 0.9 % IV SOLN: 1.0000 g | INTRAVENOUS | Status: DC

## 2015-07-20 MED ORDER — SODIUM CHLORIDE 0.9 % IV SOLN: 1.0000 g | INTRAVENOUS | Status: AC

## 2015-07-20 NOTE — Progress Notes (Signed)
Pt selected [facility] for HH IV ABX. Referral called to [name] in house rep.

## 2015-07-20 NOTE — Progress Notes (Addendum)
Pharmacy Antibiotic Note  Walter Freeman is a 65 y.o. male admitted on 07/12/2015 with diverticulitis with microperforation.  Pharmacy has been consulted for Primaxin dosing. Continue with conservative therapy at this time with imipenem.  Likely surgery if fails current therapy.   Day #8 Drain placed 4/6, 5ml output, culture sent Place PICC  Plan:  Discontinue Primaxin   Begin Ertapenem 1gm q24 for IV abx per home health.  Height: 5\' 11"  (180.3 cm) Weight: 180 lb 12.4 oz (82 kg) IBW/kg (Calculated) : 75.3  Temp (24hrs), Afebrile  Recent Labs Lab 07/19/15   WBC 8.4  CREATININE 0.90 (4/7)    Estimated Creatinine Clearance: 86.6 mL/min (by C-G formula based on Cr of 0.93).    Allergies  Allergen Reactions  . Lisinopril Anaphylaxis  . Other     EITHER Bactrim OR Biaxin; pt does not remember which.  Gas and bloating.   Mahi mahi fish caused lips to swell.    Marland Kitchen. Penicillins Swelling and Rash       Antimicrobials this admission: Primaxin 3/31 >> 4/7 Ertapenem 4/7 >>  Microbiology results: 4/6 AbscessCx: sent, no organisms seen on gram stain  Thank you for allowing pharmacy to be a part of this patient's care.  Otho BellowsGreen, Walter Freeman L PharmD Pager [phone] 07/20/2015, 8:15 AM

## 2015-07-20 NOTE — Progress Notes (Signed)
[facility] Progress Note     Subjective: Pt feels fine.  No abdominal pain.  Having BM's and flatus.  Tolerating a soft diet for several days.  Pending PICC line.  Ambulating well.  Objective: Vital signs in last 24 hours: Temp:  [97.9 F (36.6 C)-98.5 F (36.9 C)] 97.9 F (36.6 C) (04/07 0639) Pulse Rate:  [70-86] 71 (04/07 0639) Resp:  [12-19] 16 (04/07 0639) BP: (109-138)/(57-79) 128/73 mmHg (04/07 0639) SpO2:  [94 %-100 %] 94 % (04/07 0639) Last BM Date: 07/19/15  Intake/Output from previous day: 04/06 0701 - 04/07 0700 In: 1655 [I.V.:1250; IV Piggyback:400] Out: 1615 [Urine:1600; Drains:15] Intake/Output this shift: Total I/O In: 5 [Other:5] Out: -   PE: Gen:  Alert, NAD, pleasant Card:  RRR, no M/G/R heard Pulm:  Room air, no distress, good effort Abd: Soft, NT/ND, +BS, no HSM, drain with serosanguinous drainage, slight cloudiness (59mL/24hr)   Lab Results:   Recent Labs  07/19/15 0506  WBC 8.4  HGB 12.8*  HCT 36.7*  PLT 352   BMET  Recent Labs  07/18/15 1625 07/20/15 0021  NA 143  --   K 3.5  --   CL 108  --   CO2 24  --   GLUCOSE 113*  --   BUN 7  --   CREATININE 0.91 0.90  CALCIUM 9.4  --    PT/INR  Recent Labs  07/19/15 0506  LABPROT 14.4  INR 1.15   CMP     Component Value Date/Time   NA 143 07/18/2015 1625   K 3.5 07/18/2015 1625   CL 108 07/18/2015 1625   CO2 24 07/18/2015 1625   GLUCOSE 113* 07/18/2015 1625   BUN 7 07/18/2015 1625   CREATININE 0.90 07/20/2015 0021   CALCIUM 9.4 07/18/2015 1625   PROT 7.7 07/12/2015 1217   ALBUMIN 4.8 07/12/2015 1217   AST 24 07/12/2015 1217   ALT 25 07/12/2015 1217   ALKPHOS 81 07/12/2015 1217   BILITOT 1.1 07/12/2015 1217   GFRNONAA >60 07/20/2015 0021   GFRAA >60 07/20/2015 0021   Lipase     Component Value Date/Time   LIPASE 34 07/12/2015 1217       Studies/Results: Ct Image Guided Drainage By Percutaneous Catheter  07/19/2015  INDICATION: Pelvic  diverticular abscess EXAM: CT IMAGE GUIDED DRAINAGE BY PERCUTANEOUS CATHETER MEDICATIONS: The patient is currently admitted to the hospital and receiving intravenous antibiotics. The antibiotics were administered within an appropriate time frame prior to the initiation of the procedure. ANESTHESIA/SEDATION: Fentanyl 100 mcg IV; Versed 4 mg IV Moderate Sedation Time:  23 The patient was continuously monitored during the procedure by the interventional radiology nurse under my direct supervision. COMPLICATIONS: None immediate. PROCEDURE: Informed written consent was obtained from the patient after a thorough discussion of the procedural risks, benefits and alternatives. All questions were addressed. Maximal Sterile Barrier Technique was utilized including caps, mask, sterile gowns, sterile gloves, sterile drape, hand hygiene and skin antiseptic. A timeout was performed prior to the initiation of the procedure. Previous imaging reviewed. Patient positioned supine. Noncontrast localization CT performed. The anterior pelvic sigmoid diverticular abscess superior to the bladder was localized. Under sterile conditions and local anesthesia, an 18 gauge 10 cm access needle was advanced from an anterior left oblique approach into the fluid collection. Needle position was confirmed in the air-fluid collection. Guidewire inserted which coiled within the abscess cavity. Tract dilatation performed to insert a 10 [place] drain. Retention loop formed in the abscess cavity.  Position confirmed with CT. Syringe aspiration yielded 5 cc purulent fluid. Sample sent for Gram stain and culture. Catheter secured with a Prolene suture and connected to external suction bulb. Sterile dressing applied. No immediate complication. Patient tolerated the procedure well. IMPRESSION: Successful CT-guided pelvic diverticular abscess drain insertion. Electronically Signed   By: Judie PetitM.  Shick M.D.   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   -He has done well with medical management with IV antibiotics. Switch from Primaxin to [facility] for easier dosing at home. -Place PICC and he will get IV antibiotics at home.  HH ordered for drain care and IV antibiotics. -Plan on repeat CT next Thursday. IR is arranging CT with contrast with fistula study through drain.  He will follow up with Dr. Abbey Chattersosenbower in the office on April 19th at 11:30am -Plan for colonoscopy in 6-8 weeks and an elective partial colectomy after the colonoscopy ID-Primaxin D#8/21, will need to finish 3 weeks of IV antibiotics (last treatment day will be April 20th - the day after his follow up) FEN-Soft diet, IVF VTE prophylaxis-SCD/lovenox Dispo-discharge today or tomorrow depending on [facility] arranged on the weekend.    LOS: 8 days    Nonie HoyerMegan N Jacques Willingham 07/20/2015, 11:00 AM Pager: [phone]  (7am - 4:30pm M-F; 7am - 11:30am Sa/Su)

## 2015-07-20 NOTE — Progress Notes (Signed)
Please call [facility] rep [name] at [phone] when pt has PICC inserted and ready for discharge.  Please fax med list/dose time/PICC info and orders once placed to [name] at [facility] [phone]. Thank you.

## 2015-07-20 NOTE — Progress Notes (Signed)
Pt instructed on how to empty, measure, and record JP drain fluid.  Also instructed on how to flush/irrigate drain.  Pt verbalized understanding but will need reinforced teaching prior to discharge. Walter Freeman, Walter Freeman

## 2015-07-20 NOTE — Progress Notes (Signed)
Patient ID: Walter Freeman, male   DOB: 09-Jan-1951, 65 y.o.   MRN: [id number]    Referring Physician(s): Rosenbower,Todd  Supervising Physician: Jolaine Click  Chief Complaint:  Diverticular abscess  Subjective:  Pt feeling better today; has some soreness at drain site LLQ; denies N/V; has had BM, passing gas; ambulating ok  Allergies: Lisinopril; Other; and Penicillins  Medications: Prior to Admission medications   Medication Sig Start Date End Date Taking? Authorizing Provider  ALPRAZolam (XANAX) 0.25 MG tablet Take 0.25 mg by mouth 4 (four) times daily as needed for anxiety.   Yes Historical Provider, MD  amLODipine (NORVASC) 10 MG tablet Take 10 mg by mouth daily.   Yes Historical Provider, MD  ertapenem 1 g in sodium chloride 0.9 % 50 mL Inject 1 g into the vein daily. Last dosage will be on 08/02/2015. 07/20/15 08/02/15  Nonie Hoyer, PA-C  HYDROcodone-acetaminophen (NORCO/VICODIN) 5-325 MG tablet Take 1-2 tablets by mouth every 4 (four) hours as needed (pain.).   Yes Historical Provider, MD  omeprazole (PRILOSEC) 20 MG capsule Take 20 mg by mouth daily.   Yes Historical Provider, MD     Vital Signs: BP 128/73 mmHg  Pulse 71  Temp(Src) 97.9 F (36.6 C) (Oral)  Resp 16  Ht  (1.803 m)  Wt 180 lb 12.4 oz (82 kg)  BMI 25.22 kg/m2  SpO2 94%  Physical Exam LLQ drain intact, insertion site ok, mildly tender, output 15-20 cc blood-tinged fluid; drain irrigated with sterile NS with minimal return  Imaging: Ct Abdomen Pelvis W Contrast  07/18/2015  CLINICAL DATA:  Followup diverticulitis EXAM: CT ABDOMEN AND PELVIS WITH CONTRAST TECHNIQUE: Multidetector CT imaging of the abdomen and pelvis was performed using the standard protocol following bolus administration of intravenous contrast. CONTRAST:  ISOVUE-300 IOPAMIDOL (ISOVUE-300) INJECTION 61% COMPARISON:  07/12/2015 FINDINGS: Lower chest: Small bilateral pleural effusions identified. Nonspecific pneumonitis is  identified within both lower lobes. Hepatobiliary: No focal liver abnormality. Sludge and/or small stones noted within the gallbladder. No biliary dilatation. Pancreas: Unremarkable appearance of the pancreas. Spleen: Normal in size. Adrenals/Urinary Tract: The adrenal glands are unremarkable. Bilateral renal cysts are noted. Within the upper pole of the left kidney there is an intermediate attenuating lesion which measures 1.3 cm and 38 HU. Urinary bladder appears normal. Stomach/Bowel: The stomach is normal. The small bowel loops have a normal course and caliber. No bowel obstruction. No pathologic dilatation of the colon. Wall thickening of the sigmoid colon is again noted compatible with the clinical history of perforated diverticulitis. Inflammation within the surrounding soft tissues is noted. There is associated wall thickening involving the right lower quadrant small bowel loops. Contained perforation is again noted. This contains gas and fluid measuring 5.3 x 2.7 x 3.6 cm, image 72 of series 2 and image 58 of series 602. On the previous exam phlegmon within this area measured roughly 2.4 x 1.5 x 4.9 cm. Vascular/Lymphatic: Calcified atherosclerotic disease involves the abdominal aorta. No aneurysm. No enlarged retroperitoneal or mesenteric adenopathy. No enlarged pelvic or inguinal lymph nodes. Reproductive: Prostate gland and seminal vesicles appear normal Other: As above. Musculoskeletal: No aggressive lytic or sclerotic bone lesion identified IMPRESSION: 1. Again noted is sequelae of perforated diverticulitis involving the sigmoid colon. 2. Gas and fluid collection is identified within the pelvis adjacent to the sigmoid colon and small bowel loops measuring 5.3 x 2.7 x 3.6 cm. This appears larger, more well-defined with increased gas and fluid. Electronically Signed   By: Ladona Ridgel  Bradly ChrisStroud M.D.   On: 07/18/2015 10:29   Ct Image Guided Drainage By Percutaneous Catheter  07/19/2015  INDICATION: Pelvic  diverticular abscess EXAM: CT IMAGE GUIDED DRAINAGE BY PERCUTANEOUS CATHETER MEDICATIONS: The patient is currently admitted to the hospital and receiving intravenous antibiotics. The antibiotics were administered within an appropriate time frame prior to the initiation of the procedure. ANESTHESIA/SEDATION: Fentanyl 100 mcg IV; Versed 4 mg IV Moderate Sedation Time:  23 The patient was continuously monitored during the procedure by the interventional radiology nurse under my direct supervision. COMPLICATIONS: None immediate. PROCEDURE: Informed written consent was obtained from the patient after a thorough discussion of the procedural risks, benefits and alternatives. All questions were addressed. Maximal Sterile Barrier Technique was utilized including caps, mask, sterile gowns, sterile gloves, sterile drape, hand hygiene and skin antiseptic. A timeout was performed prior to the initiation of the procedure. Previous imaging reviewed. Patient positioned supine. Noncontrast localization CT performed. The anterior pelvic sigmoid diverticular abscess superior to the bladder was localized. Under sterile conditions and local anesthesia, an 18 gauge 10 cm access needle was advanced from an anterior left oblique approach into the fluid collection. Needle position was confirmed in the air-fluid collection. Guidewire inserted which coiled within the abscess cavity. Tract dilatation performed to insert a 10 [place] drain. Retention loop formed in the abscess cavity. Position confirmed with CT. Syringe aspiration yielded 5 cc purulent fluid. Sample sent for Gram stain and culture. Catheter secured with a Prolene suture and connected to external suction bulb. Sterile dressing applied. No immediate complication. Patient tolerated the procedure well. IMPRESSION: Successful CT-guided pelvic diverticular abscess drain insertion. Electronically Signed   By: Judie PetitM.  Shick M.D.   On: 07/19/2015 17:02    Labs:  CBC:  Recent Labs   07/14/15 0533 07/15/15 0857 07/16/15 1039 07/19/15 0506  WBC 19.6* 10.8* 7.3 8.4  HGB 12.1* 12.7* 12.6* 12.8*  HCT 36.3* 37.1* 36.1* 36.7*  PLT 236 265 292 352    COAGS:  Recent Labs  07/19/15 0506  INR 1.15    BMP:  Recent Labs  07/13/15 0505 07/14/15 0533 07/15/15 0857 07/18/15 1625 07/20/15 0021  NA 141 141 139 143  --   K 4.0 3.7 3.3* 3.5  --   CL 107 109 108 108  --   CO2 24 24 23 24   --   GLUCOSE 138* 127* 106* 113*  --   BUN 12 13 9 7   --   CALCIUM 8.7* 8.6* 8.8* 9.4  --   CREATININE 0.94 1.05 0.80 0.91 0.90  GFRNONAA >60 >60 >60 >60 >60  GFRAA >60 >60 >60 >60 >60    LIVER FUNCTION TESTS:  Recent Labs  07/12/15 1217  BILITOT 1.1  AST 24  ALT 25  ALKPHOS 81  PROT 7.7  ALBUMIN 4.8    Assessment and Plan: S/p diverticular abscess drainage 4/6; AF; no new lab data; check final drain fluid cx's; cont drain irrigation with sterile NS once daily as OP, change dressing every 1-2 days prn; record output of drain as OP; will set up for f/u CT with IV contrast/drain injection late next week at IR drain clinic   Electronically Signed: D. Jeananne RamaKevin Evann Koelzer 07/20/2015, 12:24 PM   I spent a total of 15 minutes at the the patient's bedside AND on the patient's hospital floor or unit, greater than 50% of which was counseling/coordinating care for pelvic abscess drain

## 2015-07-20 NOTE — Progress Notes (Addendum)
Nutrition Follow-up  DOCUMENTATION CODES:   Not applicable  INTERVENTION:  - Continue Soft diet - Will trial Prostat once/day. This supplement provides 100 kcal and 15 grams of protein.  - RD will continue to monitor for needs  NUTRITION DIAGNOSIS:   Inadequate oral intake related to inability to eat as evidenced by NPO status. -resolving with diet advancement  GOAL:   Patient will meet greater than or equal to 90% of their needs -unmet on average  MONITOR:   Weight trends, Labs, Skin, I & O's  ASSESSMENT:   [age] yom who is fairly healthy with psh of IH presents with abdominal pain. He has history of colonoscopy about 10-12 years ago. He was admitted for at least 2 weeks he states to hospital in [place] for diverticular disease. He got better slowly but sounds like he was on some abx and antifungals. He also eventually went for laparoscopic washout and drain placement. He was discharged it sounds like without drain about three days later. He has done pretty well since then and has moved to this area. Yesterday night he had acute onset abdominal pain. He was able to sleep after using xanax and vicodin as directed by physician. He then progressed today and there was no relief. He is passing flatus.  4/7 Pt was NPO yesterday for CT-guided pelvic diverticular abscess drain insertion. He states that he ate very well for dinner last night due to being NPO for breakfast and lunch and very hungry. Pt states that taste alteration persists with ongoing IV antibiotics. He states that surgeon recommended adding hot sauce to items and pt reports that this has been helpful in masking metallic-like taste caused by antibiotics. Pt expresses frustration with inability to order higher fiber foods such as collard greens and beans. He is understanding of need for low fiber diet at this time but is interested in receiving information about when fiber content of diet can be increased. Will monitor for  abilities during admission and recommended pt work closely with care providers on outpatient basis after d/c should he require further instruction about continuing to add fiber back into his diet.   RD ordered lunch for pt in order to permit him to have collard greens for this meal. RD placed note in diet order that pt is able to have either beans or collards at one meal/day; informed pt of this. Nutrition needs unmet on average at this time. Will trial Prostat to increase protein intake. Medications reviewed; 500 mg IV Primaxin every 6 hours. IVF: NS @ 50 mL/hr. Labs reviewed.    4/4 - Pt reports tolerating liquid diets without N/V or exacerbation of abdominal pain.  - Pt received lunch tray during discussion with RD and states that all foods have tasted "off" in some way and that food smells have been altered as well.  - Pt continues to receive IV antibiotics which may be causing taste alteration; will continue to monitor taste sensations.  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 - Pt states resolution of IBS/IBS symptoms since drain placement in January 2017 and beginning taking Xanax to aid with his anxiety. - Pt with several questions concerning nutrition/nutrition-recommendations and reports frustration with conflicting diet recommendations from multiple health care providers.  - After drain placement in January pt began slowly adding fiber back into his diet and was drinking at least 1/2-1 gallon of fluids/day. - He states that he was doing  very well on this diet and having several BMs/day which he was pleased with as he had often experienced difficulty with bowel habits in the past. - Pt very knowledgeable about food and what constitutes high and low fiber and does not require education on this topic.  - Pt states that after surgery in January he had been performing strenuous physical activity related to going to the  gym as well as moving from previous residence to an apartment with 3 flights of steps in the [place] area. Per surgical PA note this AM at 1138: -Continue medical management with IV antibiotics. Plan on repeat CT scan tomorrow or Thursday since last one was on 07/12/15. -Plan for colonoscopy in 6 weeks and an elective partial colectomy after the colonoscopy -If the process does not resolve with medical treatment (which may include home IV abxs), he would need a Hartmann procedure.   3/31 - Pt has been NPO since admission and unable to meet needs.  - Pt sleeping soundly, snoring at time of visit and does not arouse to name call x3.  - No other individuals in pt's room to provide information from PTA.  - Per chart review, pt with hx of diverticular disease/diverticulitis.  - Notes also indicate that pt had diverticulitis with microperforation in January 2017 and was in the hospital for 3 weeks related to this event; note indicates that since that time pt reports no issues with eating or bowel habits.  - Per surgery PA note at 0852 this AM, pt may require Hartmann's.  - Note also states plan to discuss abscess drainage with IR although doubtful this will be feasible.    Diet Order:  DIET SOFT Room service appropriate?: Yes; Fluid consistency:: Thin  Skin:  Reviewed, no issues  Last BM:  4/6  Height:   Ht Readings from Last 1 Encounters:  07/13/15  (1.803 m)    Weight:   Wt Readings from Last 1 Encounters:  07/13/15 180 lb 12.4 oz (82 kg)    Ideal Body Weight:  78.18 kg (kg)  BMI:  Body mass index is 25.22 kg/(m^2).  Estimated Nutritional Needs:   Kcal:  2050-2215 (25-27 kcal/kg)  Protein:  82-98 grams (1-1.2 grams/kg)  Fluid:  >/2 L/day  EDUCATION NEEDS:   No education needs identified at this time     Trenton Gammon, RD, LDN Inpatient Clinical Dietitian Pager # [phone] After hours/weekend pager # [phone]

## 2015-07-20 NOTE — Discharge Summary (Signed)
[facility] Discharge Summary   Patient ID: Walter Freeman MRN: [id number] DOB/AGE: 05/25/1950 65 y.o.  Admit date: 07/12/2015 Discharge date: 07/21/2015  Admitting Diagnosis: Recurrent, complicated perforated diverticulitis with abscess  Discharge Diagnosis Patient Active Problem List   Diagnosis Date Noted  . Perforated diverticulum of large intestine 07/12/2015    Consultants Dr. Miles CostainShick - Interventional Radiology  Imaging: No results found.  Procedures Dr. Miles CostainShick (07/19/15) - CT guided pelvic diverticular abscess drain insertion  Hospital Course:  [age] y/y white male who is fairly healthy with PSH of IH presents with abdominal pain. He has history of colonoscopy about 10-12 years ago. He was admitted for at least 2 weeks he [facility] [place]. He got better slowly, but sounds like he was on some abx and antifungals. He also eventually went for laparoscopic washout and drain placement.  He was discharged it sounds like without drain about three days later.  He has done pretty well since then and has moved to this area.  Yesterday night he had acute onset abdominal pain. He was able to sleep after using xanax and vicodin as directed by physician. He then progressed today and there was no relief. He is passing flatus. General surgery was consulted for admission.  Workup showed complicated recurrent perforated sigmoid diverticulitis with abscess.  WBC 18.1.  The location was not amenable to perc drainage.  He was started on IV antibiotics (Primaxin) and treated conservatively.  Ultimately his pain started to improve and his diet was advanced slowly.  A repeat CT scan was done on 07/18/15 which showed a larger more well-defined abscess with increased gas and fluid.  Luckily, IR was able to successfully place the drain under CT guidance.  It has continued to be slightly cloudy but mostly serosanguinous.  He will continue the drain upon  discharge.  Diet was advanced as tolerated and he will remain on a soft diet.  HH has been arranged for nursing care and help with drain and IV antibiotics.  He will discharge home with a PICC line and IV Invanz for a total course of antibiotics of 3 weeks (last day is April 20th).  On HD #10, the patient was voiding well, tolerating diet, ambulating well, pain well controlled, vital signs stable, and felt stable for discharge home.  Patient will follow up in our office in 1.5 weeks weeks and knows to call with questions or concerns.  IR will arrange drain clinic follow up for repeat CT +/- fistula study through the drain.  Plan is for colonoscopy in 6-8 weeks, then elective partial colectomy after colonoscopy.        Medication List    TAKE these medications        ALPRAZolam 0.25 MG tablet  Commonly known as:  XANAX  Take 0.25 mg by mouth 4 (four) times daily as needed for anxiety.     amLODipine 10 MG tablet  Commonly known as:  NORVASC  Take 10 mg by mouth daily.     ertapenem 1 g in sodium chloride 0.9 % 50 mL  Inject 1 g into the vein daily. Last dosage will be on 08/02/2015.     HYDROcodone-acetaminophen 5-325 MG tablet  Commonly known as:  NORCO/VICODIN  Take 1-2 tablets by mouth every 4 (four) hours as needed (pain.).     omeprazole 20 MG capsule  Commonly known as:  PRILOSEC  Take 20 mg by mouth daily.  Follow-up Information    Follow up with Adolph Pollack, MD. Go on 08/01/2015.   Specialty:  General Surgery   Why:   For post-hospital follow up with Dr. Abbey Chatters on 08/01/2015.  Your appointment is at 11:30, but please arrive no later than 11:00am to check in and fill out paperwork.     Contact information:   [address] [place] [address] [phone]       Follow up with Berdine Dance, MD. Call in 1 week.   Specialty:  Interventional Radiology   Why:  For post-hospital follow up and repeat CT scan.  Please call to verify appointment  date and time in "drain clinic"   Contact information:   [address] [place] [address] [phone]       Follow up with [facility].   Why:  will send Home Health RN    Contact information:   [phone]      Signed: Nonie Hoyer, [facility] Surgery [phone]  07/23/2015, 3:58 PM

## 2015-07-21 MED ORDER — SODIUM CHLORIDE 0.9% FLUSH: 10.0000 mL | INTRAVENOUS | Status: DC | PRN

## 2015-07-21 MED ORDER — SODIUM CHLORIDE 0.9% FLUSH: 10.0000 mL | Freq: Two times a day (BID) | INTRAVENOUS | Status: DC

## 2015-07-21 NOTE — Progress Notes (Signed)
Peripherally Inserted Central Catheter/Midline Placement  The IV Nurse has discussed with the patient and/or persons authorized to consent for the patient, the purpose of this procedure and the potential benefits and risks involved with this procedure.  The benefits include less needle sticks, lab draws from the catheter and patient may be discharged home with the catheter.  Risks include, but not limited to, infection, bleeding, blood clot (thrombus formation), and puncture of an artery; nerve damage and irregular heat beat.  Alternatives to this procedure were also discussed.  PICC/Midline Placement Documentation  PICC Single Lumen 07/21/15 PICC Right Brachial 39 cm 0 cm (Active)  Indication for Insertion or Continuance of Line Home intravenous therapies (PICC only) 07/21/2015  8:35 AM  Exposed Catheter (cm) 0 cm 07/21/2015  8:35 AM  Site Assessment Clean;Dry;Intact 07/21/2015  8:35 AM  Line Status Flushed;Saline locked;Blood return noted 07/21/2015  8:35 AM  Dressing Type Transparent 07/21/2015  8:35 AM  Dressing Status Clean;Dry;Intact 07/21/2015  8:35 AM  Dressing Change Due 07/28/15 07/21/2015  8:35 AM       Ethelda Chickurrie, Viaan Knippenberg Robert 07/21/2015, 8:36 AM

## 2015-07-21 NOTE — Discharge Instructions (Signed)
Diverticulitis Diverticulitis is inflammation or infection of small pouches in your colon that form when you have a condition called diverticulosis. The pouches in your colon are called diverticula. Your colon, or large intestine, is where water is absorbed and stool is formed. Complications of diverticulitis can include:  Bleeding.  Severe infection.  Severe pain.  Perforation of your colon.  Obstruction of your colon. CAUSES  Diverticulitis is caused by bacteria. Diverticulitis happens when stool becomes trapped in diverticula. This allows bacteria to grow in the diverticula, which can lead to inflammation and infection. RISK FACTORS People with diverticulosis are at risk for diverticulitis. Eating a diet that does not include enough fiber from fruits and vegetables may make diverticulitis more likely to develop. SYMPTOMS  Symptoms of diverticulitis may include:  Abdominal pain and tenderness. The pain is normally located on the left side of the abdomen, but may occur in other areas.  Fever and chills.  Bloating.  Cramping.  Nausea.  Vomiting.  Constipation.  Diarrhea.  Blood in your stool. DIAGNOSIS  Your health care provider will ask you about your medical history and do a physical exam. You may need to have tests done because many medical conditions can cause the same symptoms as diverticulitis. Tests may include:  Blood tests.  Urine tests.  Imaging tests of the abdomen, including X-rays and CT scans. When your condition is under control, your health care provider may recommend that you have a colonoscopy. A colonoscopy can show how severe your diverticula are and whether something else is causing your symptoms. TREATMENT  Most cases of diverticulitis are mild and can be treated at home. Treatment may include:  Taking over-the-counter pain medicines.  Following low fiber diet.  Taking antibiotic through PICC daily. HOME CARE INSTRUCTIONS   Follow your  health care provider's instructions carefully.  Follow a full liquid diet or other diet as directed by your health care provider. After your symptoms improve, your health care provider may tell you to change your diet. He or she may recommend you eat a high-fiber diet. Fruits and vegetables are good sources of fiber. Fiber makes it easier to pass stool.  Take fiber supplements or probiotics as directed by your health care provider.  Only take medicines as directed by your health care provider.  Keep all your follow-up appointments. SEEK MEDICAL CARE IF:   Your pain does not improve.  You have a hard time eating food.  Your bowel movements do not return to normal. SEEK IMMEDIATE MEDICAL CARE IF:   Your pain becomes worse.  Your symptoms do not get better.  Your symptoms suddenly get worse.  You have a fever.  You have repeated vomiting.  You have bloody or black, tarry stools. MAKE SURE YOU:   Understand these instructions.  Will watch your condition.  Will get help right away if you are not doing well or get worse.  OFFICE APPOINTMENT with Dr. Abbey Chattersosenbower 08/01/15 at 11:00 to fill out some paperwork and then see the doctor at approximately 11:30. [phone] for any questions or problems.)   This information is not intended to replace advice given to you by your health care provider. Make sure you discuss any questions you have with your health care provider.   Document Released: 01/08/2005 Document Revised: 04/05/2013 Document Reviewed: 02/23/2013 Elsevier Interactive Patient Education [employer]2016 Elsevier Inc.

## 2015-07-21 NOTE — Progress Notes (Signed)
Pt educated on flushing and emptying JP drain. Pt demonstrated both skills appropriately. Walter SicksK. Jezabelle Chisolm RN

## 2015-07-21 NOTE — Progress Notes (Signed)
Subjective: No complaints. PICC in without trouble.  Feels comfortable with PICC and drain care.  Objective: Vital signs in last 24 hours: Temp:  [98.2 F (36.8 C)-98.4 F (36.9 C)] 98.2 F (36.8 C) (04/08 0649) Pulse Rate:  [76-80] 80 (04/08 0649) Resp:  [18] 18 (04/08 0649) BP: (121-154)/(74-83) 133/83 mmHg (04/08 0649) SpO2:  [98 %-100 %] 98 % (04/08 0649) Weight:  [80.196 kg (176 lb 12.8 oz)] 80.196 kg (176 lb 12.8 oz) (04/07 1444) Last BM Date: 07/20/15  Intake/Output from previous day: 04/07 0701 - 04/08 0700 In: 1580 [P.O.:240; I.V.:1175; IV Piggyback:150] Out: 612 [Urine:600; Drains:12] Intake/Output this shift: Total I/O In: 5 [Other:5] Out: -   PE: General- In NAD Abdomen-soft, no tenderness, drain with clearer fluid Lab Results:   Recent Labs  07/19/15 0506  WBC 8.4  HGB 12.8*  HCT 36.7*  PLT 352   BMET  Recent Labs  07/18/15 1625 07/20/15 0021  NA 143  --   K 3.5  --   CL 108  --   CO2 24  --   GLUCOSE 113*  --   BUN 7  --   CREATININE 0.91 0.90  CALCIUM 9.4  --    PT/INR  Recent Labs  07/19/15 0506  LABPROT 14.4  INR 1.15   Comprehensive Metabolic Panel:    Component Value Date/Time   NA 143 07/18/2015 1625   NA 139 07/15/2015 0857   K 3.5 07/18/2015 1625   K 3.3* 07/15/2015 0857   CL 108 07/18/2015 1625   CL 108 07/15/2015 0857   CO2 24 07/18/2015 1625   CO2 23 07/15/2015 0857   BUN 7 07/18/2015 1625   BUN 9 07/15/2015 0857   CREATININE 0.90 07/20/2015 0021   CREATININE 0.91 07/18/2015 1625   GLUCOSE 113* 07/18/2015 1625   GLUCOSE 106* 07/15/2015 0857   CALCIUM 9.4 07/18/2015 1625   CALCIUM 8.8* 07/15/2015 0857   AST 24 07/12/2015 1217   ALT 25 07/12/2015 1217   ALKPHOS 81 07/12/2015 1217   BILITOT 1.1 07/12/2015 1217   PROT 7.7 07/12/2015 1217   ALBUMIN 4.8 07/12/2015 1217     Studies/Results: Ct Image Guided Drainage By Percutaneous Catheter  07/19/2015  INDICATION: Pelvic diverticular abscess EXAM: CT IMAGE  GUIDED DRAINAGE BY PERCUTANEOUS CATHETER MEDICATIONS: The patient is currently admitted to the hospital and receiving intravenous antibiotics. The antibiotics were administered within an appropriate time frame prior to the initiation of the procedure. ANESTHESIA/SEDATION: Fentanyl 100 mcg IV; Versed 4 mg IV Moderate Sedation Time:  23 The patient was continuously monitored during the procedure by the interventional radiology nurse under my direct supervision. COMPLICATIONS: None immediate. PROCEDURE: Informed written consent was obtained from the patient after a thorough discussion of the procedural risks, benefits and alternatives. All questions were addressed. Maximal Sterile Barrier Technique was utilized including caps, mask, sterile gowns, sterile gloves, sterile drape, hand hygiene and skin antiseptic. A timeout was performed prior to the initiation of the procedure. Previous imaging reviewed. Patient positioned supine. Noncontrast localization CT performed. The anterior pelvic sigmoid diverticular abscess superior to the bladder was localized. Under sterile conditions and local anesthesia, an 18 gauge 10 cm access needle was advanced from an anterior left oblique approach into the fluid collection. Needle position was confirmed in the air-fluid collection. Guidewire inserted which coiled within the abscess cavity. Tract dilatation performed to insert a 10 [place] drain. Retention loop formed in the abscess cavity. Position confirmed with CT. Syringe aspiration yielded 5 cc purulent  fluid. Sample sent for Gram stain and culture. Catheter secured with a Prolene suture and connected to external suction bulb. Sterile dressing applied. No immediate complication. Patient tolerated the procedure well. IMPRESSION: Successful CT-guided pelvic diverticular abscess drain insertion. Electronically Signed   By: Judie PetitM.  Shick M.D.   On: 07/19/2015 17:02    Anti-infectives: Anti-infectives    Start     Dose/Rate Route  Frequency Ordered Stop   07/20/15 1800  ertapenem (INVANZ) 1 g in sodium chloride 0.9 % 50 mL IVPB     1 g 100 mL/hr over 30 Minutes Intravenous Every 24 hours 07/20/15 1128     07/20/15 1800  ertapenem (INVANZ) 1 g in sodium chloride 0.9 % 50 mL IVPB  Status:  Discontinued     1 g 100 mL/hr over 30 Minutes Intravenous Every 24 hours 07/20/15 1157 07/20/15 1212   07/20/15 0000  ertapenem 1 g in sodium chloride 0.9 % 50 mL     1 g 100 mL/hr over 30 Minutes Intravenous Every 24 hours 07/20/15 1133 08/02/15 2359   07/13/15 0030  imipenem-cilastatin (PRIMAXIN) 500 mg in sodium chloride 0.9 % 100 mL IVPB  Status:  Discontinued     500 mg 200 mL/hr over 30 Minutes Intravenous 4 times per day 07/13/15 0022 07/20/15 1128   07/12/15 2100  ertapenem (INVANZ) 1 g in sodium chloride 0.9 % 50 mL IVPB     1 g 100 mL/hr over 30 Minutes Intravenous  Once 07/12/15 2059 07/12/15 2206      Assessment Active Problems:   Recurrent, complicated sigmoid diverticulitis with microperforation and pelvic fluid collection s/p perc drain placement-ready for discharge with home health care  Chronic anxiety-takes Xanax at home, prn.     LOS: 9 days   Plan: Discharge today.  Instructions have been given to him re drain clinic/imaging appointments and office appointment.  IV InVanz through PICC  Jackquline Branca J 07/21/2015

## 2015-07-21 NOTE — Care Management (Signed)
Faxed Discharge Summary, IV antibiotic prescription and clinicals to [facility]. Physicist, medicalCrystal Emmalin Jaquess RN BSN CCM

## 2015-07-22 ENCOUNTER — Emergency Department (HOSPITAL_COMMUNITY)
Admission: EM | Admit: 2015-07-22 | Discharge: 2015-07-22 | Disposition: A | Discharge status: Home / Self Care | Payer: BLUE CROSS/BLUE SHIELD | Attending: Emergency Medicine | Admitting: Emergency Medicine

## 2015-07-22 ENCOUNTER — Encounter (HOSPITAL_COMMUNITY): Payer: Self-pay | Admitting: Family Medicine

## 2015-07-22 DIAGNOSIS — Z880 Allergy status to penicillin: Secondary | ICD-10-CM | POA: Diagnosis not present

## 2015-07-22 DIAGNOSIS — K219 Gastro-esophageal reflux disease without esophagitis: Secondary | ICD-10-CM | POA: Diagnosis not present

## 2015-07-22 DIAGNOSIS — Y658 Other specified misadventures during surgical and medical care: Secondary | ICD-10-CM | POA: Diagnosis not present

## 2015-07-22 DIAGNOSIS — T82838A Hemorrhage of vascular prosthetic devices, implants and grafts, initial encounter: Secondary | ICD-10-CM | POA: Insufficient documentation

## 2015-07-22 DIAGNOSIS — Z79899 Other long term (current) drug therapy: Secondary | ICD-10-CM | POA: Diagnosis not present

## 2015-07-22 DIAGNOSIS — I10 Essential (primary) hypertension: Secondary | ICD-10-CM | POA: Insufficient documentation

## 2015-07-22 MED ORDER — SODIUM CHLORIDE 0.9% FLUSH
10.0000 mL | INTRAVENOUS | Status: DC | PRN
  Administered 2015-07-22: 20 mL
  Filled 2015-07-22: qty 40

## 2015-07-22 NOTE — Care Management Note (Signed)
Case Management Note  Patient Details  Name: Walter ManMichael Freeman MRN: [id number] Date of Birth: 03-01-1951  Subjective/Objective:   Perforated diverticulum of large intestine                   Action/Plan: Late entry 07/21/2015 0930 NCM received call from [facility], spoke to [name]. The will come out on 4/8 or 4/9 to set IV abx at home. Will need RX, dc summary and info on PICC. Unit RN will administer dose 07/21/2015.  07/21/2015 1335 NCM contacted [name] with [facility] and left message that pt scheduled next dose is 07/22/2015. Please see NCM notes.   Expected Discharge Date:  07/20/2017             Expected Discharge Plan:  Home w Home Health Services  In-House Referral:  NA  Discharge planning Services  CM Consult  Post Acute Care Choice:  Home Health Choice offered to:  Patient  DME Arranged:  N/A DME Agency:  NA  HH Arranged:  RN, IV Antibiotics HH Agency:  Other - See comment  Status of Service:  Completed, signed off  Medicare Important Message Given:    Date Medicare IM Given:    Medicare IM give by:    Date Additional Medicare IM Given:    Additional Medicare Important Message give by:     If discussed at Long Length of Stay Meetings, dates discussed:    Additional Comments:  Elliot CousinShavis, Kadesia Robel Ellen, RN 07/22/2015, 6:20 PM

## 2015-07-22 NOTE — ED Provider Notes (Signed)
CSN: [id number]     Arrival date & time 07/22/15  2046 History   First MD Initiated Contact with Patient 07/22/15 2050     Chief Complaint  Patient presents with  . Vascular Access Problem     (Consider location/radiation/quality/duration/timing/severity/associated sxs/prior Treatment) HPI Comments: Patient presents to the emergency department with chief complaint of PICC line problem. Patient states that he had difficulty flushing his PICC line today. He also notes that there is bleeding at the site of the PICC line. He denies any pain. Denies any redness, or purulent discharge. Denies any fevers chills. The PICC line was placed yesterday for IV antibiotics because of diverticulitis. Patient is followed by Dr. Abbey Chatters. Patient denies any injuries to the right arm. Denies any heavy lifting. Denies any numbness, weakness, or tingling.  The history is provided by the patient. No language interpreter was used.    Past Medical History  Diagnosis Date  . Diverticulitis   . GERD (gastroesophageal reflux disease)   . Hypertension   . IBS (irritable bowel syndrome)    Past Surgical History  Procedure Laterality Date  . Abdominal surgery     History reviewed. No pertinent family history. Social History  Substance Use Topics  . Smoking status: Never Smoker   . Smokeless tobacco: None  . Alcohol Use: No    Review of Systems  Constitutional: Negative for fever and chills.  Respiratory: Negative for shortness of breath.   Cardiovascular: Negative for chest pain.  Gastrointestinal: Negative for nausea, vomiting, diarrhea and constipation.  Genitourinary: Negative for dysuria.  Skin:       Mild bleeding from PICC line site  All other systems reviewed and are negative.     Allergies  Lisinopril; Other; and Penicillins  Home Medications   Prior to Admission medications   Medication Sig Start Date End Date Taking? Authorizing Provider  ALPRAZolam (XANAX) 0.25 MG tablet Take 0.25  mg by mouth 4 (four) times daily as needed for anxiety.   Yes Historical Provider, MD  amLODipine (NORVASC) 10 MG tablet Take 10 mg by mouth daily.   Yes Historical Provider, MD  ertapenem 1 g in sodium chloride 0.9 % 50 mL Inject 1 g into the vein daily. Last dosage will be on 08/02/2015. 07/20/15 08/02/15 Yes Megan Meredith Leeds, PA-C  HYDROcodone-acetaminophen (NORCO/VICODIN) 5-325 MG tablet Take 1-2 tablets by mouth every 4 (four) hours as needed (pain.).   Yes Historical Provider, MD  omeprazole (PRILOSEC) 20 MG capsule Take 20 mg by mouth daily.   Yes Historical Provider, MD   BP 134/72 mmHg  Pulse 79  Temp(Src) 98.3 F (36.8 C) (Oral)  Resp 16  SpO2 100% Physical Exam  Constitutional: He is oriented to person, place, and time. He appears well-developed and well-nourished.  HENT:  Head: Normocephalic and atraumatic.  Eyes: Conjunctivae and EOM are normal. Pupils are equal, round, and reactive to light. Right eye exhibits no discharge. Left eye exhibits no discharge. No scleral icterus.  Neck: Normal range of motion. Neck supple. No JVD present.  Cardiovascular: Normal rate, regular rhythm, normal heart sounds and intact distal pulses.  Exam reveals no gallop and no friction rub.   No murmur heard. Pulmonary/Chest: Effort normal and breath sounds normal. No respiratory distress. He has no wheezes. He has no rales. He exhibits no tenderness.  Abdominal: Soft. He exhibits no distension and no mass. There is no tenderness. There is no rebound and no guarding.  Drain in left lower quadrant  Musculoskeletal: Normal  range of motion. He exhibits no edema or tenderness.  Neurological: He is alert and oriented to person, place, and time.  Skin: Skin is warm and dry.  PICC line in right upper extremity with mild bleeding, no erythema, no discharge  Psychiatric: He has a normal mood and affect. His behavior is normal. Judgment and thought content normal.  Nursing note and vitals reviewed.   ED Course   Procedures (including critical care time)   MDM   Final diagnoses:  Bleeding from PICC line, initial encounter [facility](HCC)    Patient here for evaluation of PICC line. There is some bleeding around the site. Will consult IV team.  IV team replaced bandage around PICC insertion site.  Bleeding has stopped after holding pressure for 30 minutes.  PICC line is functional.  Patient is stable and ready for discharge.  Return precautions given in written and verbal form.  Discussed patient with Dr. Freida BusmanAllen who agrees with plan for discharge.    Roxy Horsemanobert Makaylia Hewett, PA-C 07/22/15 2241  Lorre NickAnthony Allen, MD 07/25/15 [phone]

## 2015-07-22 NOTE — Discharge Instructions (Signed)
PICC Home Guide A peripherally inserted central catheter (PICC) is a long, thin, flexible tube that is inserted into a vein in the upper arm. It is a form of intravenous (IV) access. It is considered to be a "central" line because the tip of the PICC ends in a large vein in your chest. This large vein is called the superior vena cava (SVC). The PICC tip ends in the SVC because there is a lot of blood flow in the SVC. This allows medicines and IV fluids to be quickly distributed throughout the body. The PICC is inserted using a sterile technique by a specially trained nurse or physician. After the PICC is inserted, a chest X-ray exam is done to be sure it is in the correct place.  A PICC may be placed for different reasons, such as:  To give medicines and liquid nutrition that can only be given through a central line. Examples are:  Certain antibiotic treatments.  Chemotherapy.  Total parenteral nutrition (TPN).  To take frequent blood samples.  To give IV fluids and blood products.  If there is difficulty placing a peripheral intravenous (PIV) catheter. If taken care of properly, a PICC can remain in place for several months. A PICC can also allow a person to go home from the hospital early. Medicine and PICC care can be managed at home by a family member or home health care team. WHAT PROBLEMS CAN HAPPEN WHEN I HAVE A PICC? Problems with a PICC can occasionally occur. These may include the following:  A blood clot (thrombus) forming in or at the tip of the PICC. This can cause the PICC to become clogged. A clot-dissolving medicine called tissue plasminogen activator (tPA) can be given through the PICC to help break up the clot.  Inflammation of the vein (phlebitis) in which the PICC is placed. Signs of inflammation may include redness, pain at the insertion site, red streaks, or being able to feel a "cord" in the vein where the PICC is located.  Infection in the PICC or at the insertion  site. Signs of infection may include fever, chills, redness, swelling, or pus drainage from the PICC insertion site.  PICC movement (malposition). The PICC tip may move from its original position due to excessive physical activity, forceful coughing, sneezing, or vomiting.  A break or cut in the PICC. It is important to not use scissors near the PICC.  Nerve or tendon irritation or injury during PICC insertion. WHAT SHOULD I KEEP IN MIND ABOUT ACTIVITIES WHEN I HAVE A PICC?  You may bend your arm and move it freely. If your PICC is near or at the bend of your elbow, avoid activity with repeated motion at the elbow.  Rest at home for the remainder of the day following PICC line insertion.  Avoid lifting heavy objects as instructed by your health care provider.  Avoid using a crutch with the arm on the same side as your PICC. You may need to use a walker. WHAT SHOULD I KNOW ABOUT MY PICC DRESSING?  Keep your PICC bandage (dressing) clean and dry to prevent infection.  Ask your health care provider when you may shower. Ask your health care provider to teach you how to wrap the PICC when you do take a shower.  Change the PICC dressing as instructed by your health care provider.  Change your PICC dressing if it becomes loose or wet. WHAT SHOULD I KNOW ABOUT PICC CARE?  Check the PICC insertion site   daily for leakage, redness, swelling, or pain.  Do not take a bath, swim, or use hot tubs when you have a PICC. Cover PICC line with clear plastic wrap and tape to keep it dry while showering.  Flush the PICC as directed by your health care provider. Let your health care provider know right away if the PICC is difficult to flush or does not flush. Do not use force to flush the PICC.  Do not use a syringe that is less than 10 mL to flush the PICC.  Never pull or tug on the PICC.  Avoid blood pressure checks on the arm with the PICC.  Keep your PICC identification card with you at all  times.  Do not take the PICC out yourself. Only a trained clinical professional should remove the PICC. SEEK IMMEDIATE MEDICAL CARE IF:  Your PICC is accidentally pulled all the way out. If this happens, cover the insertion site with a bandage or gauze dressing. Do not throw the PICC away. Your health care provider will need to inspect it.  Your PICC was tugged or pulled and has partially come out. Do not  push the PICC back in.  There is any type of drainage, redness, or swelling where the PICC enters the skin.  You cannot flush the PICC, it is difficult to flush, or the PICC leaks around the insertion site when it is flushed.  You hear a "flushing" sound when the PICC is flushed.  You have pain, discomfort, or numbness in your arm, shoulder, or jaw on the same side as the PICC.  You feel your heart "racing" or skipping beats.  You notice a hole or tear in the PICC.  You develop chills or a fever. MAKE SURE YOU:   Understand these instructions.  Will watch your condition.  Will get help right away if you are not doing well or get worse.   This information is not intended to replace advice given to you by your health care provider. Make sure you discuss any questions you have with your health care provider.   Document Released: 10/05/2002 Document Revised: 04/21/2014 Document Reviewed: 12/06/2012 Elsevier Interactive Patient Education 2016 Else[employer] Inc.  

## 2015-07-22 NOTE — ED Notes (Signed)
Spoke with Efraim KaufmannMelissa, RN with IV team. She reported what intervention she had performed. Furthermore, relayed her interventions to H. Rivera ColenRob, [place].

## 2015-07-22 NOTE — ED Notes (Signed)
Bed: WA01 Expected date:  Expected time:  Means of arrival:  Comments: EMS 

## 2015-07-22 NOTE — Progress Notes (Signed)
RUA SL PICC dsg with bloody drainage removed. Noted slight bleeding around PICC insertion site. Pressure held over site for . No active bleeding noted at this time. Gauze with tegaderm dsg applied. Pt instructed to monitor site and return to ED if site starts bleeding through dsg, otherwise can be seen by homehealth RN.

## 2015-07-22 NOTE — ED Notes (Signed)
[facility] Pt had a right arm PICC line placed yesterday for IV antibiotics. Pt had a home health visit today that didn't go well per the patient. EMS reports he has some bloody drainage coming from the tegaderm.

## 2015-07-22 NOTE — ED Notes (Addendum)
Patient has a right upper arm PICC line. Clear bandage center tegaderm over the site. There is blood present around the insert area. Pt reports slight tenderness around the site but reports he was told that would be normal. No signs of infection.

## 2015-07-23 ENCOUNTER — Other Ambulatory Visit: Payer: Self-pay | Admitting: General Surgery

## 2015-07-23 DIAGNOSIS — Z8719 Personal history of other diseases of the digestive system: Secondary | ICD-10-CM

## 2015-07-23 LAB — CULTURE, ROUTINE-ABSCESS

## 2015-07-25 LAB — ANAEROBIC CULTURE

## 2015-07-26 ENCOUNTER — Encounter: Payer: Self-pay | Admitting: Internal Medicine

## 2015-07-31 ENCOUNTER — Ambulatory Visit
Admission: RE | Admit: 2015-07-31 | Discharge: 2015-07-31 | Disposition: A | Discharge status: Home / Self Care | Payer: BLUE CROSS/BLUE SHIELD | Source: Ambulatory Visit | Attending: General Surgery | Admitting: General Surgery

## 2015-07-31 DIAGNOSIS — Z8719 Personal history of other diseases of the digestive system: Secondary | ICD-10-CM

## 2015-07-31 MED ORDER — IOPAMIDOL (ISOVUE-300) INJECTION 61%
100.0000 mL | Freq: Once | INTRAVENOUS | Status: AC | PRN
  Administered 2015-07-31: 100 mL via INTRAVENOUS

## 2015-07-31 NOTE — Progress Notes (Signed)
Chief Complaint: Status post percutaneous catheter drainage of pelvic diverticular abscess on 07/19/2015.  Referring Physician(s): Rosenbower,Todd  History of Present Illness: Walter Freeman is a 65 y.o. male status post recent hospitalization for ruptured diverticulitis with abscess formation. Focal abscess adjacent to the sigmoid colon was treated with placement of a percutaneous drainage catheter under CT guidance by Dr. Miles CostainShick on 07/19/2015. He has a PICC line in place and is receiving 1 g of ertapenem IV every day. He is scheduled to discontinue IV antibiotics in 2 days. He has been flushing the drainage catheter once a day with sterile saline. There has been no significant output from the drain over the last several days with most of what is flushed noted to be leaking around the catheter at the skin exit site. He has not had any fever or chills since discharge from the hospital.  Past Medical History  Diagnosis Date  . Diverticulitis   . GERD (gastroesophageal reflux disease)   . Hypertension   . IBS (irritable bowel syndrome)     Past Surgical History  Procedure Laterality Date  . Abdominal surgery      Allergies: Lisinopril; Other; and Penicillins  Medications: Prior to Admission medications   Medication Sig Start Date End Date Taking? Authorizing Provider  ALPRAZolam (XANAX) 0.25 MG tablet Take 0.25 mg by mouth 4 (four) times daily as needed for anxiety.    Historical Provider, MD  amLODipine (NORVASC) 10 MG tablet Take 10 mg by mouth daily.    Historical Provider, MD  ertapenem 1 g in sodium chloride 0.9 % 50 mL Inject 1 g into the vein daily. Last dosage will be on 08/02/2015. 07/20/15 08/02/15  Nonie HoyerMegan N Baird, PA-C  HYDROcodone-acetaminophen (NORCO/VICODIN) 5-325 MG tablet Take 1-2 tablets by mouth every 4 (four) hours as needed (pain.).    Historical Provider, MD  omeprazole (PRILOSEC) 20 MG capsule Take 20 mg by mouth daily.    Historical Provider, MD     No family  history on file.  Social History   Social History  . Marital Status: Single    Spouse Name: N/A  . Number of Children: N/A  . Years of Education: N/A   Social History Main Topics  . Smoking status: Never Smoker   . Smokeless tobacco: Not on file  . Alcohol Use: No  . Drug Use: No  . Sexual Activity: Not on file   Other Topics Concern  . Not on file   Social History Narrative     Review of Systems: A 12 point ROS discussed and pertinent positives are indicated in the HPI above.  All other systems are negative.  Review of Systems  Constitutional: Negative.   Respiratory: Negative.   Cardiovascular: Negative.   Gastrointestinal: Negative.   Genitourinary: Negative.   Musculoskeletal: Negative.   Neurological: Negative.     Vital Signs: BP 155/88 mmHg  Pulse 71  Temp(Src) 98.2 F (36.8 C)  SpO2 99%  Physical Exam  Constitutional: He is oriented to person, place, and time. He appears well-developed and well-nourished. No distress.  Abdominal: Soft. He exhibits no distension. There is no tenderness. There is no rebound and no guarding.  Neurological: He is alert and oriented to person, place, and time.  Skin: He is not diaphoretic.  Nursing note and vitals reviewed.    Imaging: Ct Abdomen Pelvis W Contrast  07/31/2015  CLINICAL DATA:  Status post drainage of sigmoid diverticular abscess on 07/19/2015. EXAM: CT ABDOMEN AND PELVIS WITH CONTRAST  TECHNIQUE: Multidetector CT imaging of the abdomen and pelvis was performed using the standard protocol following bolus administration of intravenous contrast. CONTRAST:  ISOVUE-300 IOPAMIDOL (ISOVUE-300) INJECTION 61% COMPARISON:  None. FINDINGS: Lower chest:  Normal visualized lung bases. Hepatobiliary: Normal appearance to the liver, gallbladder and bile ducts. Pancreas: No mass, inflammatory changes, or other significant abnormality. Spleen: Within normal limits in size and appearance. Adrenals/Urinary Tract: No masses  identified. No evidence of hydronephrosis. Normal appearance of bladder without evidence to suggest colovesical fistula. Stomach/Bowel: No evidence of bowel obstruction, free air or inflammation. The appendix appears normal. Vascular/Lymphatic: No pathologically enlarged lymph nodes. No evidence of abdominal aortic aneurysm. Reproductive: No mass or other significant abnormality. Other: Pigtail drainage catheter inserted through the left lower anterior abdominal wall shows stable positioning in the pelvis just anterior to the sigmoid colon and superior to the bladder. The drained diverticular abscess is completely decompressed with no residual fluid collection remaining. No new abscess identified. Musculoskeletal:  Bony structures are unremarkable. IMPRESSION: Resolution of sigmoid diverticular abscess by CT after percutaneous drainage. No significant inflammation or evidence of new abscess. The catheter will be additionally injected with contrast material under fluoroscopy to determine if there is any fistula to bowel prior to considering drain removal. Electronically Signed   By: Irish Lack M.D.   On: 07/31/2015 13:24   Ct Abdomen Pelvis W Contrast  07/18/2015  CLINICAL DATA:  Followup diverticulitis EXAM: CT ABDOMEN AND PELVIS WITH CONTRAST TECHNIQUE: Multidetector CT imaging of the abdomen and pelvis was performed using the standard protocol following bolus administration of intravenous contrast. CONTRAST:  ISOVUE-300 IOPAMIDOL (ISOVUE-300) INJECTION 61% COMPARISON:  07/12/2015 FINDINGS: Lower chest: Small bilateral pleural effusions identified. Nonspecific pneumonitis is identified within both lower lobes. Hepatobiliary: No focal liver abnormality. Sludge and/or small stones noted within the gallbladder. No biliary dilatation. Pancreas: Unremarkable appearance of the pancreas. Spleen: Normal in size. Adrenals/Urinary Tract: The adrenal glands are unremarkable. Bilateral renal cysts are noted.  Within the upper pole of the left kidney there is an intermediate attenuating lesion which measures 1.3 cm and 38 HU. Urinary bladder appears normal. Stomach/Bowel: The stomach is normal. The small bowel loops have a normal course and caliber. No bowel obstruction. No pathologic dilatation of the colon. Wall thickening of the sigmoid colon is again noted compatible with the clinical history of perforated diverticulitis. Inflammation within the surrounding soft tissues is noted. There is associated wall thickening involving the right lower quadrant small bowel loops. Contained perforation is again noted. This contains gas and fluid measuring 5.3 x 2.7 x 3.6 cm, image 72 of series 2 and image 58 of series 602. On the previous exam phlegmon within this area measured roughly 2.4 x 1.5 x 4.9 cm. Vascular/Lymphatic: Calcified atherosclerotic disease involves the abdominal aorta. No aneurysm. No enlarged retroperitoneal or mesenteric adenopathy. No enlarged pelvic or inguinal lymph nodes. Reproductive: Prostate gland and seminal vesicles appear normal Other: As above. Musculoskeletal: No aggressive lytic or sclerotic bone lesion identified IMPRESSION: 1. Again noted is sequelae of perforated diverticulitis involving the sigmoid colon. 2. Gas and fluid collection is identified within the pelvis adjacent to the sigmoid colon and small bowel loops measuring 5.3 x 2.7 x 3.6 cm. This appears larger, more well-defined with increased gas and fluid. Electronically Signed   By: Signa Kell M.D.   On: 07/18/2015 10:29   Ct Abdomen Pelvis W Contrast  07/12/2015  CLINICAL DATA:  Abdominal pain starting last night. History of diverticulitis. EXAM: CT ABDOMEN  AND PELVIS WITH CONTRAST TECHNIQUE: Multidetector CT imaging of the abdomen and pelvis was performed using the standard protocol following bolus administration of intravenous contrast. CONTRAST:  ISOVUE-300 IOPAMIDOL (ISOVUE-300) INJECTION 61% COMPARISON:  None.  FINDINGS: Lower chest:  Mild bibasilar atelectasis. Hepatobiliary: Liver is low in density suggesting fatty infiltration. No focal mass or lesion within the liver. Gallbladder is mildly distended but otherwise unremarkable. No evidence of cholecystitis. No bile duct dilatation. Pancreas: No mass, inflammatory changes, or other significant abnormality. Spleen: Within normal limits in size and appearance. Adrenals/Urinary Tract: No masses identified. No evidence of hydronephrosis. Stomach/Bowel: There is thickening of the walls of the mid sigmoid colon, and paracolic fluid stranding noted about this segment of the sigmoid colon, with scattered diverticulosis throughout, consistent with acute diverticulitis. Ill-defined collection of fluid and extraluminal air noted within the central pelvis, suspicious for early developing abscess collection which measures approximately 2.3 cm greatest thickness and 4.9 cm craniocaudal dimension, indicating an adjacent perforated diverticulitis. Distal small bowel abuts this extraluminal collection, with associated thickening/inflammation of the walls of the small bowel, and possible associated fistulous connection. Remainder of the bowel appears normal in caliber and configuration. No other site of bowel wall thickening or inflammation. Appendix is normal. Free intraperitoneal air noted within the upper abdomen, again indicating bowel perforation. Vascular/Lymphatic: Scattered atherosclerotic changes of the normal- caliber abdominal aorta. No acute - appearing vascular abnormality. Reproductive: No mass or other significant abnormality. Other: Small amount of additional free fluid in the lower pelvis. Free intraperitoneal air within the upper abdomen, as above. Musculoskeletal: No acute or suspicious osseous lesion. Mild degenerative change within the lumbar spine. 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  IMPRESSION: 1. Thickening of the walls of the mid sigmoid colon, with associated paracolic fluid stranding, consistent with acute diverticulitis. Free intraperitoneal air indicates associated bowel perforation (perforated diverticulitis). 2. Thin collection of fluid and extraluminal air within the midline pelvis, measuring 2.3 cm greatest thickness and approximately 4.9 cm craniocaudal dimension, located immediately adjacent to the segment of sigmoid colon diverticulitis, is highly suggestive of early developing abscess. 3. The complex collection in the midline pelvis, suspected to be early abscess formation, also abuts the adjacent distal small bowel, with associated thickening/inflammation of the walls of these small bowel loops which could be reactive thickening or indicate abscess involvement and/or fistulous connection. 4. As above, there is free intraperitoneal air within the upper abdomen, indicating bowel perforation (perforated sigmoid diverticulitis). Critical Value/emergent results were called by telephone at the time of interpretation on 07/12/2015 at 8:03 pm to Dr. Charlestine Night , who verbally acknowledged these results. Electronically Signed   By: Bary Richard M.D.   On: 07/12/2015 20:11   Ct Image Guided Drainage By Percutaneous Catheter  07/19/2015  INDICATION: Pelvic diverticular abscess EXAM: CT IMAGE GUIDED DRAINAGE BY PERCUTANEOUS CATHETER MEDICATIONS: The patient is currently admitted to the hospital and receiving intravenous antibiotics. The antibiotics were administered within an appropriate time frame prior to the initiation of the procedure. ANESTHESIA/SEDATION: Fentanyl 100 mcg IV; Versed 4 mg IV Moderate Sedation Time:  23 The patient was continuously monitored during the procedure by the interventional radiology nurse under my direct supervision. COMPLICATIONS: None immediate. PROCEDURE: Informed written consent was obtained from the patient after a thorough discussion of the  procedural risks, benefits and alternatives. All questions were addressed. Maximal Sterile Barrier Technique was utilized including caps, mask, sterile gowns, sterile gloves, sterile drape, hand hygiene and  skin antiseptic. A timeout was performed prior to the initiation of the procedure. Previous imaging reviewed. Patient positioned supine. Noncontrast localization CT performed. The anterior pelvic sigmoid diverticular abscess superior to the bladder was localized. Under sterile conditions and local anesthesia, an 18 gauge 10 cm access needle was advanced from an anterior left oblique approach into the fluid collection. Needle position was confirmed in the air-fluid collection. Guidewire inserted which coiled within the abscess cavity. Tract dilatation performed to insert a 10 [place] drain. Retention loop formed in the abscess cavity. Position confirmed with CT. Syringe aspiration yielded 5 cc purulent fluid. Sample sent for Gram stain and culture. Catheter secured with a Prolene suture and connected to external suction bulb. Sterile dressing applied. No immediate complication. Patient tolerated the procedure well. IMPRESSION: Successful CT-guided pelvic diverticular abscess drain insertion. Electronically Signed   By: Judie Petit.  Shick M.D.   On: 07/19/2015 17:02    Labs:  CBC:  Recent Labs  07/14/15 0533 07/15/15 0857 07/16/15 1039 07/19/15 0506  WBC 19.6* 10.8* 7.3 8.4  HGB 12.1* 12.7* 12.6* 12.8*  HCT 36.3* 37.1* 36.1* 36.7*  PLT 236 265 292 352    COAGS:  Recent Labs  07/19/15 0506  INR 1.15    BMP:  Recent Labs  07/13/15 0505 07/14/15 0533 07/15/15 0857 07/18/15 1625 07/20/15 0021  NA 141 141 139 143  --   K 4.0 3.7 3.3* 3.5  --   CL 107 109 108 108  --   CO2 --   GLUCOSE 138* 127* 106* 113*  --   BUN --   CALCIUM 8.7* 8.6* 8.8* 9.4  --   CREATININE 0.94 1.05 0.80 0.91 0.90  GFRNONAA >60 >60 >60 >60 >60  GFRAA >60 >60 >60 >60 >60    LIVER  FUNCTION TESTS:  Recent Labs  07/12/15 1217  BILITOT 1.1  AST 24  ALT 25  ALKPHOS 81  PROT 7.7  ALBUMIN 4.8    Assessment and Plan:  CT performed today demonstrates resolution of the diverticular abscess with no new abscess formation. Contrast injection performed of the indwelling drainage catheter demonstrates no significant abscess cavity and no fistula to bowel. Given lack of catheter output, the catheter was removed today in its entirety. The patient has a follow-up appointment with Dr. Abbey Chatters tomorrow.    Electronically SignedIrish Lack T 07/31/2015, 3:43 PM     I spent a total of 15 Minutes in face to face in clinical consultation, greater than 50% of which was counseling/coordinating care post drainage of diverticular abscess.

## 2015-08-13 HISTORY — PX: INGUINAL HERNIA REPAIR: SUR1180

## 2015-09-19 ENCOUNTER — Other Ambulatory Visit: Payer: Self-pay | Admitting: General Surgery

## 2015-09-19 DIAGNOSIS — K5732 Diverticulitis of large intestine without perforation or abscess without bleeding: Secondary | ICD-10-CM

## 2015-09-21 ENCOUNTER — Ambulatory Visit
Admission: RE | Admit: 2015-09-21 | Discharge: 2015-09-21 | Disposition: A | Discharge status: Home / Self Care | Payer: BLUE CROSS/BLUE SHIELD | Source: Ambulatory Visit | Attending: General Surgery | Admitting: General Surgery

## 2015-09-21 DIAGNOSIS — K5732 Diverticulitis of large intestine without perforation or abscess without bleeding: Secondary | ICD-10-CM

## 2015-09-21 MED ORDER — IOPAMIDOL (ISOVUE-370) INJECTION 76%
100.0000 mL | Freq: Once | INTRAVENOUS | Status: AC | PRN
  Administered 2015-09-21: 100 mL via INTRAVENOUS

## 2015-09-21 MED ORDER — IOHEXOL 300 MG/ML  SOLN
30.0000 mL | Freq: Once | INTRAMUSCULAR | Status: AC | PRN
  Administered 2015-09-21: 30 mL via ORAL

## 2015-09-24 ENCOUNTER — Other Ambulatory Visit: Payer: BLUE CROSS/BLUE SHIELD

## 2015-09-25 ENCOUNTER — Encounter: Payer: Self-pay | Admitting: General Surgery

## 2015-09-25 NOTE — Progress Notes (Unsigned)
I spoke with him regarding his CT report.  No evidence of recurrent diverticulitis, question of an enterocolic fistula. He is doing well.  Bowel habits are similar to what he had before he had his first episode of diverticulitis. I recommend that he arrange to have his colonoscopy by Dr. Dulce Sellarutlaw and then after that, I will see him in the office and discuss surgery.  IMPRESSION: 1. Sigmoid diverticulosis with resolution of the active inflammation and abscess, and removal of the prior drain. 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 Questionable more caudad second fistula between the small bowel and the sigmoid colon, images 50-50 2/6 L1, versus linear inflammatory stranding (just above the roof of the bladder). No colovesical fistula observed. 2. Other imaging findings of potential clinical significance: Hepatic steatosis. Aortoiliac atherosclerotic vascular disease. Bilateral chronic pars defects at L5 with 5 mm of grade 1 anterolisthesis. Small bilateral direct inguinal hernias containing adipose tissue.

## 2015-10-08 ENCOUNTER — Ambulatory Visit: Payer: BLUE CROSS/BLUE SHIELD | Admitting: Internal Medicine

## 2016-05-28 ENCOUNTER — Ambulatory Visit
Admission: RE | Admit: 2016-05-28 | Discharge: 2016-05-28 | Disposition: A | Discharge status: Home / Self Care | Payer: BLUE CROSS/BLUE SHIELD | Source: Ambulatory Visit | Attending: Physician Assistant | Admitting: Physician Assistant

## 2016-05-28 ENCOUNTER — Other Ambulatory Visit: Payer: Self-pay | Admitting: Physician Assistant

## 2016-05-28 DIAGNOSIS — K5900 Constipation, unspecified: Secondary | ICD-10-CM

## 2016-05-28 DIAGNOSIS — R1084 Generalized abdominal pain: Secondary | ICD-10-CM

## 2016-06-02 ENCOUNTER — Other Ambulatory Visit: Payer: Self-pay | Admitting: Physician Assistant

## 2016-06-02 DIAGNOSIS — Z8719 Personal history of other diseases of the digestive system: Secondary | ICD-10-CM

## 2016-06-02 DIAGNOSIS — R935 Abnormal findings on diagnostic imaging of other abdominal regions, including retroperitoneum: Secondary | ICD-10-CM

## 2016-06-02 DIAGNOSIS — R1030 Lower abdominal pain, unspecified: Secondary | ICD-10-CM

## 2016-06-04 ENCOUNTER — Other Ambulatory Visit: Payer: BLUE CROSS/BLUE SHIELD

## 2016-06-04 ENCOUNTER — Ambulatory Visit
Admission: RE | Admit: 2016-06-04 | Discharge: 2016-06-04 | Disposition: A | Discharge status: Home / Self Care | Payer: BLUE CROSS/BLUE SHIELD | Source: Ambulatory Visit | Attending: Physician Assistant | Admitting: Physician Assistant

## 2016-06-04 DIAGNOSIS — Z8719 Personal history of other diseases of the digestive system: Secondary | ICD-10-CM

## 2016-06-04 DIAGNOSIS — R935 Abnormal findings on diagnostic imaging of other abdominal regions, including retroperitoneum: Secondary | ICD-10-CM

## 2016-06-04 DIAGNOSIS — R1030 Lower abdominal pain, unspecified: Secondary | ICD-10-CM

## 2016-06-04 MED ORDER — IOPAMIDOL (ISOVUE-300) INJECTION 61%
100.0000 mL | Freq: Once | INTRAVENOUS | Status: AC | PRN
  Administered 2016-06-04: 100 mL via INTRAVENOUS

## 2016-12-20 IMAGING — RF DG SINUS / FISTULA TRACT / ABSCESSOGRAM
3 series · 3 of 3 positions shown · non-contrast
Comparison: none

INDICATION: Status post percutaneous drainage of pelvic diverticular abscess on
07/19/2015. Followup CT earlier today demonstrated resolution of
abscess. Contrast injection is performed to evaluate for fistula to
bowel.

[Series 1: run · 1 of 1 slices shown (1 of 3)]
[im 1/1]
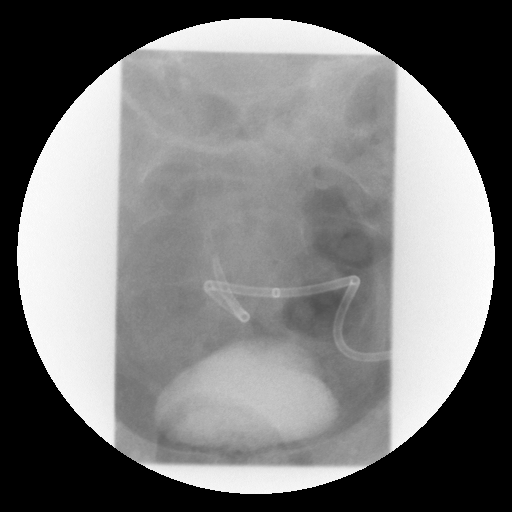

[Series 2: run · 1 of 1 slices shown (2 of 3)]
[im 1/1]
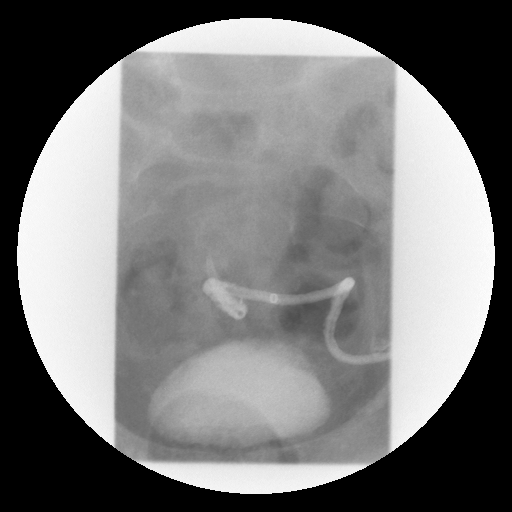

[Series 3: run · 1 of 1 slices shown (3 of 3)]
[im 1/1]
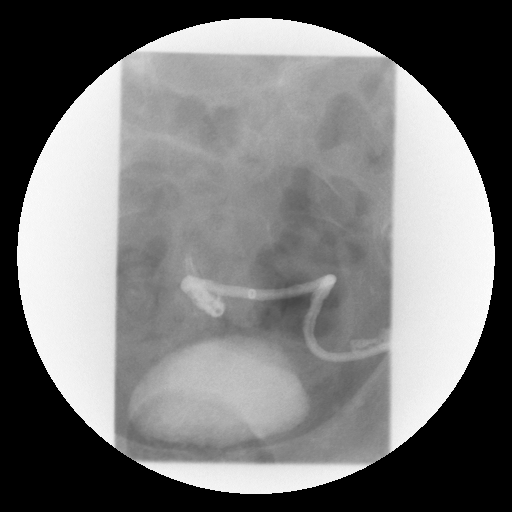

[3 of 3 positions shown; findings below may reference images not displayed]

EXAM:
ABSCESS INJECTION

MEDICATIONS:
No medications administered.

ANESTHESIA/SEDATION:
No conscious sedation.

COMPLICATIONS:
None.

PROCEDURE:
The indwelling left anterior pelvic drainage catheter was injected
with contrast material under fluoroscopy. Fluoroscopic spot images
were obtained. The catheter was then cut and removed. A dressing was
applied over the catheter exit site.
FINDINGS: Fluoroscopic contrast injection shows filling of a small cavity
conforming to the distal aspect of the drainage catheter. There is
some leakage of contrast around the catheter and into the tract of
the catheter up to the level of the skin. No fistula is demonstrated
to bowel. The catheter was removed in its entirety.
IMPRESSION: Fluoroscopic contrast injection of the diverticular abscess drainage
catheter demonstrates no evidence of significant residual abscess
cavity and no fistula to bowel. Given lack of significant drain
output and CT results, the drain was removed today.

## 2017-01-17 ENCOUNTER — Emergency Department (HOSPITAL_COMMUNITY)
Admission: EM | Admit: 2017-01-17 | Discharge: 2017-01-17 | Disposition: A | Discharge status: Home / Self Care | Payer: BLUE CROSS/BLUE SHIELD | Attending: Emergency Medicine | Admitting: Emergency Medicine

## 2017-01-17 ENCOUNTER — Encounter (HOSPITAL_COMMUNITY): Payer: Self-pay | Admitting: *Deleted

## 2017-01-17 DIAGNOSIS — I10 Essential (primary) hypertension: Secondary | ICD-10-CM | POA: Insufficient documentation

## 2017-01-17 DIAGNOSIS — Z79899 Other long term (current) drug therapy: Secondary | ICD-10-CM | POA: Insufficient documentation

## 2017-01-17 DIAGNOSIS — R350 Frequency of micturition: Secondary | ICD-10-CM | POA: Insufficient documentation

## 2017-01-17 DIAGNOSIS — N481 Balanitis: Secondary | ICD-10-CM

## 2017-01-17 LAB — URINALYSIS, ROUTINE W REFLEX MICROSCOPIC
Bilirubin Urine: NEGATIVE
Glucose, UA: NEGATIVE mg/dL
Hgb urine dipstick: NEGATIVE
Ketones, ur: NEGATIVE mg/dL
Leukocytes, UA: NEGATIVE
Nitrite: NEGATIVE
Protein, ur: NEGATIVE mg/dL
Specific Gravity, Urine: 1.006 (ref 1.005–1.030)
pH: 6 (ref 5.0–8.0)

## 2017-01-17 NOTE — Discharge Instructions (Signed)
Please read attached information. If you experience any new or worsening signs or symptoms please return to the emergency room for evaluation. Please follow-up with your primary care provider or specialist as discussed. Please continue using Clotrimazole 1% for balanitis.

## 2017-01-17 NOTE — ED Notes (Signed)
PA at bedside.

## 2017-01-17 NOTE — ED Provider Notes (Signed)
[facility]-EMERGENCY DEPT Provider Note   CSN: [id number] Arrival date & time: 01/17/17  1330     History   Chief Complaint No chief complaint on file.   HPI Walter Freeman is a 66 y.o. male.  HPI    65 year old male presents today with complaints of penile irritation.  Patient notes approximately 2-1/2 weeks ago he developed irritation around the glands and distal shaft.  He notes redness and tenderness.  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  He notes also recently masturbating causing irritation to the head of the penis with a blister.  Any penile discharge, reports that he has had urinary [place] and urgency with no dysuria. Pt reports a history of prostatic enlargement.    Past Medical History:  Diagnosis Date  . Diverticulitis   . GERD (gastroesophageal reflux disease)   . Hypertension   . IBS (irritable bowel syndrome)     Patient Active Problem List   Diagnosis Date Noted  . Perforated diverticulum of large intestine 07/12/2015    Past Surgical History:  Procedure Laterality Date  . ABDOMINAL SURGERY        Home Medications    Prior to Admission medications   Medication Sig Start Date End Date Taking? Authorizing Provider  ALPRAZolam (XANAX) 0.25 MG tablet Take 0.25 mg by mouth 4 (four) times daily as needed for anxiety.   Yes [provider]  amLODipine (NORVASC) 10 MG tablet Take 10 mg by mouth daily.   Yes [provider]  clotrimazole (LOTRIMIN) 1 % cream Apply 1 application topically 2 (two) times daily.   Yes [provider]  HYDROCHLOROTHIAZIDE PO Take 1 tablet by mouth daily.   Yes [provider]  HYDROcodone-acetaminophen (NORCO/VICODIN) 5-325 MG tablet Take 1-2 tablets by mouth every 4 (four) hours as needed (pain.).   Yes [provider]  ibuprofen (ADVIL,MOTRIN) 200 MG tablet Take 200 mg by mouth every 6 (six) hours as needed for headache or moderate pain.   Yes [provider]  omeprazole (PRILOSEC) 20 MG capsule Take 20 mg by mouth daily.   Yes [provider]  sildenafil (VIAGRA) 100 MG tablet Take 100 mg by mouth daily as needed for erectile dysfunction.   Yes [provider]  tadalafil (CIALIS) 20 MG tablet Take 20 mg by mouth daily as needed for erectile dysfunction.   Yes [provider]    Family History No family history on file.  Social History Social History  Substance Use Topics  . Smoking status: Never Smoker  . Smokeless tobacco: Never Used  . Alcohol use No     Allergies   Lisinopril; Other; and Penicillins   Review of Systems Review of Systems  All other systems reviewed and are negative.   Physical Exam Updated Vital Signs BP (!) 165/89 (BP Location: Left Arm)   Pulse 74   Temp 98.1 F (36.7 C) (Oral)   Resp 20   SpO2 100%   Physical Exam  Constitutional: He is oriented to person, place, and time. He appears well-developed and well-nourished.  HENT:  Head: Normocephalic and atraumatic.  Eyes: Pupils are equal, round, and reactive to light. Conjunctivae are normal. Right eye exhibits no discharge. Left eye exhibits no discharge. No scleral icterus.  Neck: Normal range of motion. No JVD present. No tracheal deviation present.  Pulmonary/Chest: Effort normal. No stridor.  Genitourinary:  Genitourinary Comments: Circumcised penis with irritation along the head and distal  shaft no significant discharge, blister noted to the head-no discharge  Neurological: He is alert and oriented to person, place, and time. Coordination normal.  Psychiatric: He has a normal mood and affect. His behavior is normal. Judgment and thought content normal.  Nursing note and vitals reviewed.   ED Treatments / Results  Labs (all labs ordered are listed, but only abnormal results are displayed) Labs Reviewed  URINALYSIS, ROUTINE W REFLEX MICROSCOPIC - Abnormal; Notable for the following:       Result Value     Color, Urine STRAW (*)    All other components within normal limits    EKG  EKG Interpretation None       Radiology No results found.  Procedures Procedures (including critical care time)  Medications Ordered in ED Medications - No data to display   Initial Impression / Assessment and Plan / ED Course  I have reviewed the triage vital signs and the nursing notes.  Pertinent labs & imaging results that were available during my care of the patient were reviewed by me and considered in my medical decision making (see chart for details).     Final Clinical Impressions(s) / ED Diagnoses   Final diagnoses:  Balanitis  Urinary frequency    Labs: Urinalysis  Imaging:  Consults:  Therapeutics:  Discharge Meds:   Assessment/Plan: 66 year old male presents today with likely balanitis.  He has been using clotrimazole with improvement. Pt instructed to continue using. PCP follow up for urinary frequency/ No infectious etiology, no signs of prostatitis. Pt given strict return precautions, no further questions or concerns at the time of discharge.   New Prescriptions Discharge Medication List as of 01/17/2017  3:55 PM       Eyvonne Mechanic, PA-C 01/17/17 Myrtie Cruise, MD 01/18/17 [phone]

## 2017-01-17 NOTE — ED Triage Notes (Signed)
Pt presents with "bubbles" on pt's penis.  Pt used yeast infection medication on it.  Pt reports that he now has a blister on the tip of his penis.    Pt reports it's been a while since he's has intercourse.   Pt stated that recently he has been depressed and has decreased his showering habits.  Pt also states he had ring worm on his left foot that has been improving.

## 2017-02-18 DIAGNOSIS — W57XXXA Bitten or stung by nonvenomous insect and other nonvenomous arthropods, initial encounter: Secondary | ICD-10-CM | POA: Diagnosis not present

## 2017-02-18 DIAGNOSIS — Z09 Encounter for follow-up examination after completed treatment for conditions other than malignant neoplasm: Secondary | ICD-10-CM | POA: Diagnosis not present

## 2017-02-18 DIAGNOSIS — E785 Hyperlipidemia, unspecified: Secondary | ICD-10-CM | POA: Diagnosis not present

## 2017-02-18 DIAGNOSIS — I10 Essential (primary) hypertension: Secondary | ICD-10-CM | POA: Diagnosis not present

## 2017-02-18 DIAGNOSIS — N481 Balanitis: Secondary | ICD-10-CM | POA: Diagnosis not present

## 2017-02-18 DIAGNOSIS — L989 Disorder of the skin and subcutaneous tissue, unspecified: Secondary | ICD-10-CM | POA: Diagnosis not present

## 2017-03-02 DIAGNOSIS — N481 Balanitis: Secondary | ICD-10-CM | POA: Diagnosis not present

## 2017-03-11 DIAGNOSIS — L304 Erythema intertrigo: Secondary | ICD-10-CM | POA: Diagnosis not present

## 2017-03-11 DIAGNOSIS — L280 Lichen simplex chronicus: Secondary | ICD-10-CM | POA: Diagnosis not present

## 2017-03-11 DIAGNOSIS — N5201 Erectile dysfunction due to arterial insufficiency: Secondary | ICD-10-CM | POA: Diagnosis not present

## 2017-03-11 DIAGNOSIS — L821 Other seborrheic keratosis: Secondary | ICD-10-CM | POA: Diagnosis not present

## 2018-08-20 DIAGNOSIS — I10 Essential (primary) hypertension: Secondary | ICD-10-CM | POA: Diagnosis not present

## 2018-08-20 DIAGNOSIS — K219 Gastro-esophageal reflux disease without esophagitis: Secondary | ICD-10-CM | POA: Diagnosis not present

## 2018-08-20 DIAGNOSIS — R202 Paresthesia of skin: Secondary | ICD-10-CM | POA: Diagnosis not present

## 2018-08-20 DIAGNOSIS — R21 Rash and other nonspecific skin eruption: Secondary | ICD-10-CM | POA: Diagnosis not present

## 2018-08-20 DIAGNOSIS — E785 Hyperlipidemia, unspecified: Secondary | ICD-10-CM | POA: Diagnosis not present

## 2018-08-20 DIAGNOSIS — Z13220 Encounter for screening for lipoid disorders: Secondary | ICD-10-CM | POA: Diagnosis not present

## 2018-08-20 DIAGNOSIS — F419 Anxiety disorder, unspecified: Secondary | ICD-10-CM | POA: Diagnosis not present

## 2018-08-23 DIAGNOSIS — G629 Polyneuropathy, unspecified: Secondary | ICD-10-CM | POA: Diagnosis not present

## 2018-08-23 DIAGNOSIS — L817 Pigmented purpuric dermatosis: Secondary | ICD-10-CM | POA: Diagnosis not present

## 2018-08-24 ENCOUNTER — Encounter: Payer: Self-pay | Admitting: *Deleted

## 2018-08-24 ENCOUNTER — Ambulatory Visit: Payer: Medicare Other | Admitting: Diagnostic Neuroimaging

## 2018-08-24 ENCOUNTER — Telehealth: Payer: Self-pay | Admitting: *Deleted

## 2018-08-24 NOTE — Telephone Encounter (Signed)
Pt called in and stated he couldn't make his appt due to needing to go pick up funds.

## 2018-08-24 NOTE — Telephone Encounter (Signed)
Spoke with patient and updated EMR. 

## 2018-08-24 NOTE — Telephone Encounter (Signed)
Noted, he rescheduled for tomorrow morning.

## 2018-08-25 ENCOUNTER — Ambulatory Visit (INDEPENDENT_AMBULATORY_CARE_PROVIDER_SITE_OTHER): Payer: Medicare Other | Admitting: Diagnostic Neuroimaging

## 2018-08-25 ENCOUNTER — Other Ambulatory Visit: Payer: Self-pay

## 2018-08-25 DIAGNOSIS — R200 Anesthesia of skin: Secondary | ICD-10-CM | POA: Diagnosis not present

## 2018-08-25 DIAGNOSIS — G629 Polyneuropathy, unspecified: Secondary | ICD-10-CM | POA: Diagnosis not present

## 2018-08-25 DIAGNOSIS — Z79899 Other long term (current) drug therapy: Secondary | ICD-10-CM

## 2018-08-25 DIAGNOSIS — R6889 Other general symptoms and signs: Secondary | ICD-10-CM | POA: Diagnosis not present

## 2018-08-25 NOTE — Progress Notes (Addendum)
[facility]  PATIENT: Walter ManMichael Freeman DOB: [birth year]  REFERRING CLINICIAN: Donnamae JudeA Becker HISTORY FROM: patient REASON FOR VISIT: new consult    HISTORICAL  CHIEF COMPLAINT:  No chief complaint on file.   HISTORY OF PRESENT ILLNESS:   68 year old male here for evaluation of numbness and tingling of symptoms started in 2018.  He describes tingling, numbness, itching sensation, intermittent with decreased sensitivity in his toes and balls.  Symptoms are intermittent and slightly progressed.  No significant pain.  No proximal symptoms or low back pain.  No problems with his fingers or hands.  No balance or gait difficulty.  No vision changes, slurred speech or trouble talking.  Patient was having some trouble with discoloration in his feet and some type of dry itchy skin on his toes.  He is seeing dermatology and PCP without specific diagnosis.   REVIEW OF SYSTEMS: Full 14 system review of systems performed and negative with exception of: Per HPI.   ALLERGIES: Allergies  Allergen Reactions  . Lisinopril Anaphylaxis  . Other     EITHER Bactrim OR Biaxin; pt does not remember which.  Gas and bloating.   Mahi mahi fish caused lips to swell.    Marland Kitchen. Penicillins Swelling and Rash    Has patient had a PCN reaction causing immediate rash, facial/tongue/throat swelling, SOB or lightheadedness with hypotension: {Yes Has patient had a PCN reaction causing severe rash involving mucus membranes or skin necrosis: {No Has patient had a PCN reaction that required hospitalization {No Has patient had a PCN reaction occurring within the last 10 years: {No If all of the above answers are "NO", then may proceed with Cephalosporin use.     HOME MEDICATIONS: Outpatient Medications Prior to Visit  Medication Sig Dispense Refill  . ALPRAZolam (XANAX) 0.25 MG tablet Take 0.25 mg by mouth 4 (four) times daily as needed for anxiety.    Marland Kitchen. amLODipine (NORVASC) 10 MG tablet Take 10 mg by  mouth daily.    . hydrochlorothiazide (HYDRODIURIL) 25 MG tablet Take 25 mg by mouth every morning.    Marland Kitchen. ibuprofen (ADVIL,MOTRIN) 200 MG tablet Take 200 mg by mouth every 6 (six) hours as needed for headache or moderate pain.    . tadalafil (CIALIS) 20 MG tablet Take 20 mg by mouth daily as needed for erectile dysfunction.     No facility-administered medications prior to visit.     PAST MEDICAL HISTORY: Past Medical History:  Diagnosis Date  . Anxiety   . Diverticulitis   . GERD (gastroesophageal reflux disease)   . Hyperlipidemia   . Hypertension   . IBS (irritable bowel syndrome)   . Paresthesias     PAST SURGICAL HISTORY: Past Surgical History:  Procedure Laterality Date  . ABDOMINAL SURGERY    . EYE SURGERY     fracture repair  . INGUINAL HERNIA REPAIR Right 08/2015    FAMILY HISTORY: Family History  Problem Relation Age of Onset  . Diverticulitis Mother   . Lung cancer Sister   . COPD Sister     SOCIAL HISTORY: Social History   Socioeconomic History  . Marital status: Single    Spouse name: Not on file  . Number of children: 5  . Years of education: Not on file  . Highest education level: Associate degree: academic program  Occupational History    Comment: musician  Social Needs  . Financial resource strain: Not on file  . Food insecurity:    Worry: Not on file  Inability: Not on file  . Transportation needs:    Medical: Not on file    Non-medical: Not on file  Tobacco Use  . Smoking status: Never Smoker  . Smokeless tobacco: Never Used  Substance and Sexual Activity  . Alcohol use: No  . Drug use: No  . Sexual activity: Not on file  Lifestyle  . Physical activity:    Days per week: Not on file    Minutes per session: Not on file  . Stress: Not on file  Relationships  . Social connections:    Talks on phone: Not on file    Gets together: Not on file    Attends religious service: Not on file    Active member of club or organization: Not  on file    Attends meetings of clubs or organizations: Not on file    Relationship status: Not on file  . Intimate partner violence:    Fear of current or ex partner: Not on file    Emotionally abused: Not on file    Physically abused: Not on file    Forced sexual activity: Not on file  Other Topics Concern  . Not on file  Social History Narrative   Lives alone   Caffeine- sweet tea, 1 bottle daily     PHYSICAL EXAM   VIDEO EXAM  GENERAL EXAM/CONSTITUTIONAL:  Vitals: There were no vitals filed for this visit.  There is no height or weight on file to calculate BMI. Wt Readings from Last 3 Encounters:  07/20/15 176 lb 12.8 oz (80.2 kg)     Patient is in no distress; well developed, nourished and groomed; neck is supple   NEUROLOGIC: MENTAL STATUS:  No flowsheet data found.  awake, alert, oriented to person, place and time  recent and remote memory intact  normal attention and concentration  language fluent, comprehension intact, naming intact  fund of knowledge appropriate  CRANIAL NERVE:   2nd, 3rd, 4th, 6th - visual fields full to confrontation, extraocular muscles intact, no nystagmus  5th - facial sensation symmetric  7th - facial strength symmetric  8th - hearing intact  11th - shoulder shrug symmetric  12th - tongue protrusion midline  MOTOR:   NO TREMOR; NO DRIFT IN BUE  SENSORY:   normal and symmetric to light touch  DECR TO LIGHT TOUCH IN FEET / TOES  COORDINATION:   fine finger movements normal     DIAGNOSTIC DATA (LABS, IMAGING, TESTING) - I reviewed patient records, labs, notes, testing and imaging myself where available.  Lab Results  Component Value Date   WBC 8.4 07/19/2015   HGB 12.8 (L) 07/19/2015   HCT 36.7 (L) 07/19/2015   MCV 86.2 07/19/2015   PLT 352 07/19/2015      Component Value Date/Time   NA 143 07/18/2015 1625   K 3.5 07/18/2015 1625   CL 108 07/18/2015 1625   CO2 24 07/18/2015 1625   GLUCOSE 113  (H) 07/18/2015 1625   BUN 7 07/18/2015 1625   CREATININE 0.90 07/20/2015 0021   CALCIUM 9.4 07/18/2015 1625   PROT 7.7 07/12/2015 1217   ALBUMIN 4.8 07/12/2015 1217   AST 24 07/12/2015 1217   ALT 25 07/12/2015 1217   ALKPHOS 81 07/12/2015 1217   BILITOT 1.1 07/12/2015 1217   GFRNONAA >60 07/20/2015 0021   GFRAA >60 07/20/2015 0021   No results found for: CHOL, HDL, LDLCALC, LDLDIRECT, TRIG, CHOLHDL No results found for: ZOXW9U No results found for: VITAMINB12 No  results found for: TSH  06/04/16 CT abd/pelvis 1. Although there are colonic diverticula primarily in the rectosigmoid colon, no present evidence of diverticulitis is seen. 2. On the prior CT there was a question of 1 or 2 fistulae within the mid pelvis. Neither of these questioned fistulae is detected on the current study. 3. Question of fatty infiltration of the liver. 4. Age advanced abdominal aortic atherosclerosis. 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   ASSESSMENT AND PLAN  68 y.o. year old male here with new onset numbness and tingling in toes and feet since 2018, may represent peripheral neuropathy.  Dx:  1. Numbness   2. Neuropathy   3. Encounter for long-term (current) use of medications   4. Other general symptoms and signs    Virtual Visit via Video Note  I connected with Walter Freeman on 08/25/18 at  8:30 AM EDT by a video enabled telemedicine application and verified that I am speaking with the correct person using two identifiers.  Location: Patient: home Provider: office   I discussed the limitations of evaluation and management by telemedicine and the availability of in person appointments. The patient expressed understanding and agreed to proceed.   I discussed the assessment and treatment plan with the patient. The patient was provided an opportunity to ask questions and all were answered. The patient agreed with the plan and demonstrated an understanding of the instructions.   The patient was advised to  call back or seek an in-person evaluation if the symptoms worsen or if the condition fails to improve as anticipated.  I provided 30 minutes of non-face-to-face time during this encounter.    PLAN:  - check neuropathy labs - consider MRI lumbar spine and EMG / NCS in future  Orders Placed This Encounter  Procedures  . Vitamin B12  . Hemoglobin A1c  . TSH  . ANA,IFA RA Diag Pnl w/rflx Tit/Patn  . Angiotensin converting enzyme  . CBC with Differential/Platelet  . Comprehensive metabolic panel   Return pending test results, for pending if symptoms worsen or fail to improve.    Suanne Marker, MD 08/25/2018, 8:53 AM Certified in Neurology, Neurophysiology and Neuroimaging  [facility] [address], [address] [place], [place] [address] [phone]

## 2018-10-22 ENCOUNTER — Encounter (HOSPITAL_COMMUNITY): Payer: Self-pay

## 2018-10-22 ENCOUNTER — Emergency Department (HOSPITAL_COMMUNITY)
Admission: EM | Admit: 2018-10-22 | Discharge: 2018-10-23 | Disposition: A | Discharge status: Home / Self Care | Payer: Medicare Other | Attending: Emergency Medicine | Admitting: Emergency Medicine

## 2018-10-22 ENCOUNTER — Other Ambulatory Visit: Payer: Self-pay

## 2018-10-22 ENCOUNTER — Emergency Department (HOSPITAL_COMMUNITY): Payer: Medicare Other

## 2018-10-22 DIAGNOSIS — I10 Essential (primary) hypertension: Secondary | ICD-10-CM | POA: Insufficient documentation

## 2018-10-22 DIAGNOSIS — Y999 Unspecified external cause status: Secondary | ICD-10-CM | POA: Insufficient documentation

## 2018-10-22 DIAGNOSIS — S0101XA Laceration without foreign body of scalp, initial encounter: Secondary | ICD-10-CM | POA: Diagnosis not present

## 2018-10-22 DIAGNOSIS — S4991XA Unspecified injury of right shoulder and upper arm, initial encounter: Secondary | ICD-10-CM | POA: Diagnosis not present

## 2018-10-22 DIAGNOSIS — Z23 Encounter for immunization: Secondary | ICD-10-CM | POA: Diagnosis not present

## 2018-10-22 DIAGNOSIS — S01512A Laceration without foreign body of oral cavity, initial encounter: Secondary | ICD-10-CM | POA: Insufficient documentation

## 2018-10-22 DIAGNOSIS — S0102XA Laceration with foreign body of scalp, initial encounter: Secondary | ICD-10-CM | POA: Diagnosis not present

## 2018-10-22 DIAGNOSIS — Y939 Activity, unspecified: Secondary | ICD-10-CM | POA: Diagnosis not present

## 2018-10-22 DIAGNOSIS — W19XXXA Unspecified fall, initial encounter: Secondary | ICD-10-CM | POA: Diagnosis not present

## 2018-10-22 DIAGNOSIS — W07XXXA Fall from chair, initial encounter: Secondary | ICD-10-CM | POA: Diagnosis not present

## 2018-10-22 DIAGNOSIS — M25521 Pain in right elbow: Secondary | ICD-10-CM | POA: Insufficient documentation

## 2018-10-22 DIAGNOSIS — S5001XA Contusion of right elbow, initial encounter: Secondary | ICD-10-CM | POA: Diagnosis not present

## 2018-10-22 DIAGNOSIS — S0990XA Unspecified injury of head, initial encounter: Secondary | ICD-10-CM | POA: Diagnosis present

## 2018-10-22 DIAGNOSIS — S0093XA Contusion of unspecified part of head, initial encounter: Secondary | ICD-10-CM | POA: Diagnosis not present

## 2018-10-22 DIAGNOSIS — M47812 Spondylosis without myelopathy or radiculopathy, cervical region: Secondary | ICD-10-CM | POA: Diagnosis not present

## 2018-10-22 DIAGNOSIS — M25511 Pain in right shoulder: Secondary | ICD-10-CM | POA: Insufficient documentation

## 2018-10-22 DIAGNOSIS — Y929 Unspecified place or not applicable: Secondary | ICD-10-CM | POA: Diagnosis not present

## 2018-10-22 DIAGNOSIS — S40011A Contusion of right shoulder, initial encounter: Secondary | ICD-10-CM | POA: Diagnosis not present

## 2018-10-22 DIAGNOSIS — T07XXXA Unspecified multiple injuries, initial encounter: Secondary | ICD-10-CM

## 2018-10-22 DIAGNOSIS — F0781 Postconcussional syndrome: Secondary | ICD-10-CM | POA: Diagnosis not present

## 2018-10-22 DIAGNOSIS — S1191XA Laceration without foreign body of unspecified part of neck, initial encounter: Secondary | ICD-10-CM | POA: Diagnosis not present

## 2018-10-22 MED ORDER — LIDOCAINE HCL 4 % EX SOLN
Freq: Once | CUTANEOUS | Status: AC
  Administered 2018-10-23: via TOPICAL
  Filled 2018-10-22: qty 50

## 2018-10-22 MED ORDER — TETANUS-DIPHTH-ACELL PERTUSSIS 5-2.5-18.5 LF-MCG/0.5 IM SUSP
0.5000 mL | Freq: Once | INTRAMUSCULAR | Status: AC
  Administered 2018-10-23: 0.5 mL via INTRAMUSCULAR
  Filled 2018-10-22: qty 0.5

## 2018-10-22 MED ORDER — LIDOCAINE-EPINEPHRINE (PF) 2 %-1:200000 IJ SOLN
20.0000 mL | Freq: Once | INTRAMUSCULAR | Status: AC
  Administered 2018-10-23: 20 mL
  Filled 2018-10-22: qty 20

## 2018-10-22 NOTE — ED Notes (Signed)
Pt transported to CT ?

## 2018-10-22 NOTE — ED Triage Notes (Signed)
Pt BIB [facility] from home. Pt was sitting in a chair, which broke. Pt hit the back of his head which has a laceration and also bit his tongue. Pt is also c/o right shoulder pain and neck pain. Pt is currently in a C-collar due to neck pain. Denies LOC, blood thinners or dizziness.

## 2018-10-23 ENCOUNTER — Emergency Department (HOSPITAL_COMMUNITY): Payer: Medicare Other

## 2018-10-23 DIAGNOSIS — M25521 Pain in right elbow: Secondary | ICD-10-CM | POA: Diagnosis not present

## 2018-10-23 DIAGNOSIS — S1191XA Laceration without foreign body of unspecified part of neck, initial encounter: Secondary | ICD-10-CM | POA: Diagnosis not present

## 2018-10-23 DIAGNOSIS — S4991XA Unspecified injury of right shoulder and upper arm, initial encounter: Secondary | ICD-10-CM | POA: Diagnosis not present

## 2018-10-23 DIAGNOSIS — S0101XA Laceration without foreign body of scalp, initial encounter: Secondary | ICD-10-CM | POA: Diagnosis not present

## 2018-10-23 DIAGNOSIS — M25511 Pain in right shoulder: Secondary | ICD-10-CM | POA: Diagnosis not present

## 2018-10-23 MED ORDER — IBUPROFEN 400 MG PO TABS: 400.0000 mg | ORAL_TABLET | Freq: Four times a day (QID) | ORAL | 0 refills | Status: DC | PRN

## 2018-10-23 MED ORDER — NAPROXEN 375 MG PO TABS
375.0000 mg | ORAL_TABLET | Freq: Once | ORAL | Status: AC
  Administered 2018-10-23: 375 mg via ORAL
  Filled 2018-10-23: qty 1

## 2018-10-23 MED ORDER — LIDOCAINE 2 % EX GEL: 5.0000 mL | Freq: Three times a day (TID) | CUTANEOUS | 0 refills | Status: DC | PRN

## 2018-10-23 MED ORDER — ACETAMINOPHEN ER 650 MG PO TBCR: 650.0000 mg | EXTENDED_RELEASE_TABLET | Freq: Three times a day (TID) | ORAL | 0 refills | Status: DC | PRN

## 2018-10-23 NOTE — ED Notes (Signed)
Dressing applied to pt's scalp. Pt confirmed he will have his son come and help him at home, if needed for keeping it clean.

## 2018-10-23 NOTE — Discharge Instructions (Signed)
We saw you in the ER for your WOUND. Please read the instructions provided on how to take care of your wound. Keep the area clean and dry.  RETURN TO THE ER IF THERE IS INCREASED PAIN, REDNESS, PUS COMING OUT from the wound site.  See orthopedic doctors in 1 week if the shoulder pain continues.

## 2018-10-23 NOTE — ED Provider Notes (Signed)
[facility] DEPT Provider Note   CSN: [id number] Arrival date & time: 10/22/18  2204    History   Chief Complaint Chief Complaint  Patient presents with   Head Laceration    HPI Walter Freeman is a 68 y.o. male.     HPI  68 year old male comes in a chief complaint of fall and resultant head and tongue injury.  Patient states that he was sitting on a chair, which suddenly broke.  Patient fell and struck his head onto the wall, and started having bleeding.  He also accidentally bit his tongue, which was also bleeding.  Additionally is complaining of right-sided elbow and shoulder pain.  Patient is not on any blood thinners and he is not up-to-date with his tetanus shot.  Review of system is negative for any neurologic symptoms or loss of consciousness.  Patient does have headache and neck pain.  Past Medical History:  Diagnosis Date   Anxiety    Diverticulitis    GERD (gastroesophageal reflux disease)    Hyperlipidemia    Hypertension    IBS (irritable bowel syndrome)    Paresthesias     Patient Active Problem List   Diagnosis Date Noted   Perforated diverticulum of large intestine 07/12/2015    Past Surgical History:  Procedure Laterality Date   ABDOMINAL SURGERY     EYE SURGERY     fracture repair   INGUINAL HERNIA REPAIR Right 08/2015        Home Medications    Prior to Admission medications   Medication Sig Start Date End Date Taking? Authorizing Provider  amLODipine (NORVASC) 10 MG tablet Take 10 mg by mouth daily.   Yes [provider]  hydrochlorothiazide (HYDRODIURIL) 25 MG tablet Take 25 mg by mouth every morning. 08/21/18  Yes [provider]  tadalafil (CIALIS) 20 MG tablet Take 20 mg by mouth daily as needed for erectile dysfunction.   Yes [provider]  acetaminophen (TYLENOL 8 HOUR) 650 MG CR tablet Take 1 tablet (650 mg total) by mouth every 8 (eight) hours as needed. 10/23/18    Varney Biles, MD  ibuprofen (ADVIL) 400 MG tablet Take 1 tablet (400 mg total) by mouth every 6 (six) hours as needed. 10/23/18   Varney Biles, MD  Lidocaine 2 % GEL Apply 5 mLs topically 3 (three) times daily as needed. 10/23/18   Varney Biles, MD    Family History Family History  Problem Relation Age of Onset   Diverticulitis Mother    Lung cancer Sister    COPD Sister     Social History Social History   Tobacco Use   Smoking status: Never Smoker   Smokeless tobacco: Never Used  Substance Use Topics   Alcohol use: No   Drug use: No     Allergies   Lisinopril, Other, and Penicillins   Review of Systems Review of Systems  Constitutional: Positive for activity change.  Respiratory: Negative for shortness of breath.   Cardiovascular: Negative for chest pain.  Gastrointestinal: Negative for abdominal pain.  Musculoskeletal: Positive for arthralgias.  Skin: Positive for wound.  Neurological: Positive for headaches. Negative for dizziness.  Hematological: Does not bruise/bleed easily.  All other systems reviewed and are negative.    Physical Exam Updated Vital Signs BP (!) 165/82 (BP Location: Right Arm)    Pulse 95    Temp 98 F (36.7 C) (Oral)    Resp 18    SpO2 96%   Physical Exam Vitals  signs and nursing note reviewed.  Constitutional:      Appearance: He is well-developed.  HENT:     Head: Atraumatic.  Neck:     Musculoskeletal: Neck supple.  Cardiovascular:     Rate and Rhythm: Normal rate.  Pulmonary:     Effort: Pulmonary effort is normal.  Musculoskeletal:     Comments: Patient has tenderness over the right elbow and right shoulder, both anterior and posteriorly.  He has a 5 to 6 cm laceration over the occiput. He has 2 areas of less than 2 cm laceration to the right side of the tongue, 1 of them is cephalad, the other and caudad part of the tongue.  OTHERWISE:  Head to toe evaluation shows no hematoma, bleeding of the scalp, no  facial abrasions, no spine step offs, crepitus of the chest or neck, no tenderness to palpation of the bilateral upper and lower extremities, no gross deformities, no chest tenderness, no pelvic pain.   Skin:    General: Skin is warm.  Neurological:     Mental Status: He is alert and oriented to person, place, and time.      ED Treatments / Results  Labs (all labs ordered are listed, but only abnormal results are displayed) Labs Reviewed - No data to display  EKG None  Radiology Ct Head Wo Contrast  Result Date: 10/23/2018 CLINICAL DATA:  Hit back of head, laceration EXAM: CT HEAD WITHOUT CONTRAST CT CERVICAL SPINE WITHOUT CONTRAST TECHNIQUE: Multidetector CT imaging of the head and cervical spine was performed following the standard protocol without intravenous contrast. Multiplanar CT image reconstructions of the cervical spine were also generated. COMPARISON:  None. FINDINGS: CT HEAD FINDINGS Brain: No acute territorial infarction, hemorrhage or intracranial mass. Mild atrophy. Nonenlarged ventricles. Vascular: No hyperdense vessels.  Carotid vascular calcification Skull: No fracture. Anterior subluxation of the left TMJ of uncertain chronicity. Sinuses/Orbits: Hyperdense secretions or soft tissue in the left maxillary sinus. Opacified left frontal sinus and near complete opacification of left ethmoid sinus. Polypoid appearing mass in the left nasal passage. Other: Small midline posterior scalp laceration CT CERVICAL SPINE FINDINGS Alignment: Straightening of the cervical spine. No subluxation. Facet alignment is normal Skull base and vertebrae: No acute fracture. No primary bone lesion or focal pathologic process. Soft tissues and spinal canal: No prevertebral fluid or swelling. No visible canal hematoma. Disc levels: Moderate degenerative change at C5-C6, C6-C7 and C7-T1. Multiple level facet degenerative change with bilateral foraminal narrowing. Upper chest: Negative. Other: None  IMPRESSION: 1. No CT evidence for acute intracranial abnormality.  Atrophy. 2. Straightening of the cervical spine with degenerative change. No acute osseous abnormality. 3. Anterior subluxation of left mandibular head with respect to the TMJ fossa of uncertain chronicity 4. Suspected polypoid mass in the left nasal passage with completely opacified left maxillary, frontal and near completely opacified left ethmoid sinuses. Findings could be secondary to polyposis, recommend correlation with direct visualization. Electronically Signed   By: Jasmine PangKim  Fujinaga M.D.   On: 10/23/2018 00:03   Ct Cervical Spine Wo Contrast  Result Date: 10/23/2018 CLINICAL DATA:  Hit back of head, laceration EXAM: CT HEAD WITHOUT CONTRAST CT CERVICAL SPINE WITHOUT CONTRAST TECHNIQUE: Multidetector CT imaging of the head and cervical spine was performed following the standard protocol without intravenous contrast. Multiplanar CT image reconstructions of the cervical spine were also generated. COMPARISON:  None. FINDINGS: CT HEAD FINDINGS Brain: No acute territorial infarction, hemorrhage or intracranial mass. Mild atrophy. Nonenlarged ventricles. Vascular: No hyperdense  vessels.  Carotid vascular calcification Skull: No fracture. Anterior subluxation of the left TMJ of uncertain chronicity. Sinuses/Orbits: Hyperdense secretions or soft tissue in the left maxillary sinus. Opacified left frontal sinus and near complete opacification of left ethmoid sinus. Polypoid appearing mass in the left nasal passage. Other: Small midline posterior scalp laceration CT CERVICAL SPINE FINDINGS Alignment: Straightening of the cervical spine. No subluxation. Facet alignment is normal Skull base and vertebrae: No acute fracture. No primary bone lesion or focal pathologic process. Soft tissues and spinal canal: No prevertebral fluid or swelling. No visible canal hematoma. Disc levels: Moderate degenerative change at C5-C6, C6-C7 and C7-T1. Multiple level facet  degenerative change with bilateral foraminal narrowing. Upper chest: Negative. Other: None IMPRESSION: 1. No CT evidence for acute intracranial abnormality.  Atrophy. 2. Straightening of the cervical spine with degenerative change. No acute osseous abnormality. 3. Anterior subluxation of left mandibular head with respect to the TMJ fossa of uncertain chronicity 4. Suspected polypoid mass in the left nasal passage with completely opacified left maxillary, frontal and near completely opacified left ethmoid sinuses. Findings could be secondary to polyposis, recommend correlation with direct visualization. Electronically Signed   By: Jasmine PangKim  Fujinaga M.D.   On: 10/23/2018 00:03    Procedures .Marland Kitchen.Laceration Repair  Date/Time: 10/23/2018 1:18 AM Performed by: Derwood KaplanNanavati, Goldye Tourangeau, MD Authorized by: Derwood KaplanNanavati, Garrett Mitchum, MD   Consent:    Consent obtained:  Verbal   Consent given by:  Patient   Risks discussed:  Infection, pain, poor cosmetic result and poor wound healing Universal protocol:    Procedure explained and questions answered to patient or proxy's satisfaction: yes   Anesthesia (see MAR for exact dosages):    Anesthesia method:  Local infiltration and topical application   Topical anesthetic:  Lidocaine gel   Local anesthetic:  Lidocaine 2% WITH epi Laceration details:    Location:  Scalp   Scalp location:  Occipital   Length (cm):  5   Depth (mm):  2 Repair type:    Repair type:  Simple Pre-procedure details:    Preparation:  Patient was prepped and draped in usual sterile fashion Exploration:    Hemostasis achieved with:  Direct pressure   Wound exploration: wound explored through full range of motion and entire depth of wound probed and visualized     Wound extent: no fascia violation noted, no muscle damage noted and no vascular damage noted     Contaminated: no   Treatment:    Area cleansed with:  Saline   Amount of cleaning:  Standard   Irrigation solution:  Sterile saline   Irrigation  method:  Syringe   Visualized foreign bodies/material removed: yes   Skin repair:    Repair method:  Staples   Number of staples:  5 Approximation:    Approximation:  Loose Post-procedure details:    Dressing:  Sterile dressing and non-adherent dressing   Patient tolerance of procedure:  Tolerated well, no immediate complications   (including critical care time)  Medications Ordered in ED Medications  Tdap (BOOSTRIX) injection 0.5 mL (0.5 mLs Intramuscular Given 10/23/18 0006)  lidocaine (XYLOCAINE) 4 % external solution ( Topical Given 10/23/18 0005)  lidocaine-EPINEPHrine (XYLOCAINE W/EPI) 2 %-1:200000 (PF) injection 20 mL (20 mLs Infiltration Given by Other 10/23/18 0006)     Initial Impression / Assessment and Plan / ED Course  I have reviewed the triage vital signs and the nursing notes.  Pertinent labs & imaging results that were available during my care of the  patient were reviewed by me and considered in my medical decision making (see chart for details).        68 year old comes in a chief complaint of fall. He has large linear laceration to the scalp and 2 small lacerations to the tongue which are less than 2 cm and not gaping and deep.  The tongue laceration will not be repaired given they are small and not gaping.  Patient prefers not getting the tongue repaired if it can heal on its own.  The scalp laceration will be fixed. CT head, C-spine and radiographs of the right upper extremity was ordered.  Final Clinical Impressions(s) / ED Diagnoses   Final diagnoses:  Multiple contusions  Scalp laceration, initial encounter  Post concussion syndrome    ED Discharge Orders         Ordered    acetaminophen (TYLENOL 8 HOUR) 650 MG CR tablet  Every 8 hours PRN     10/23/18 0125    ibuprofen (ADVIL) 400 MG tablet  Every 6 hours PRN     10/23/18 0125    Lidocaine 2 % GEL  3 times daily PRN     10/23/18 0125           Derwood KaplanNanavati, Namiyah Grantham, MD 10/23/18 0131

## 2018-10-29 DIAGNOSIS — Z4802 Encounter for removal of sutures: Secondary | ICD-10-CM | POA: Diagnosis not present

## 2018-10-29 DIAGNOSIS — W19XXXD Unspecified fall, subsequent encounter: Secondary | ICD-10-CM | POA: Diagnosis not present

## 2018-11-24 DIAGNOSIS — I10 Essential (primary) hypertension: Secondary | ICD-10-CM | POA: Diagnosis not present

## 2018-11-24 DIAGNOSIS — R7301 Impaired fasting glucose: Secondary | ICD-10-CM | POA: Diagnosis not present

## 2018-11-24 DIAGNOSIS — Z13220 Encounter for screening for lipoid disorders: Secondary | ICD-10-CM | POA: Diagnosis not present

## 2018-11-24 DIAGNOSIS — Z136 Encounter for screening for cardiovascular disorders: Secondary | ICD-10-CM | POA: Diagnosis not present

## 2018-12-27 DIAGNOSIS — R7301 Impaired fasting glucose: Secondary | ICD-10-CM | POA: Diagnosis not present

## 2018-12-27 DIAGNOSIS — R748 Abnormal levels of other serum enzymes: Secondary | ICD-10-CM | POA: Diagnosis not present

## 2019-01-04 ENCOUNTER — Encounter

## 2019-01-05 ENCOUNTER — Encounter: Payer: Medicare Other | Attending: Family Medicine | Admitting: Registered"

## 2019-01-05 ENCOUNTER — Other Ambulatory Visit: Payer: Self-pay

## 2019-01-05 DIAGNOSIS — E785 Hyperlipidemia, unspecified: Secondary | ICD-10-CM | POA: Diagnosis not present

## 2019-01-05 DIAGNOSIS — I10 Essential (primary) hypertension: Secondary | ICD-10-CM | POA: Diagnosis not present

## 2019-01-05 DIAGNOSIS — R7303 Prediabetes: Secondary | ICD-10-CM | POA: Insufficient documentation

## 2019-01-05 NOTE — Progress Notes (Signed)
Medical Nutrition Therapy:  Appt start time: 0930 end time:  1035.  Assessment:  Primary concerns today: Pt referred due to prediabetes and essential hypertension. Pt present for appointment alone. Pt reports he was given many pamphlets regarding low fat, low salt, and low carb diet and he feels very frustrated. Reports he has lost 12 lb and loves vegetables. Reports feeling very limited with eating. 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 Reports he loves potatoes, pasta, and rice but pt has been trying to avoid/limit these foods d/t prediabetes. Pt would like to know how much is ok for him to include regarding these foods and sodium. Reports he exercises- 7-10 miles per day walking or on stationary bike. Reports he wants to turn prediabetes around because he does not want to develop diabetes. Reports trying to follow all these different diets have caused a lot of mental stress. Reports he has been hungry all the time since making recent dietary changes.   Food Allergies/Intolerances: Shelba Flake: lip swelling.   GI Concerns: IBS, diverticulosis, and GERD.   Pertinent Lab Values: N/A.   Preferred Learning Style:   No preference indicated   Learning Readiness:   Ready  MEDICATIONS: See list.    DIETARY INTAKE:  Usual eating pattern includes 3 meals and 2-3 snacks per day. Breakfast is biggest meal.   Common foods: nuts, popcorn, seafood (trout, salmon).  Avoided foods: mayonnaise.    Typical Snacks: nuts, popcorn.     Typical Beverages: Cold brew decaff tea (no sugar added); water; flavored water without artificial sweeteners.  Location of Meals: dining room.   Electronics Present at [employer]: sometimes: TV   24-hr recall:  B ( AM): bowl oatmeal (1 cup); handful berries/nuts, slice of fig sometimes, 2-3 egg whites, piece of whole grain toast usually but not yesterday, 1-2 cups decaf coffee with Coffee Mate creamer  Snk (  AM): fruit or vegetables  L ( PM): [employer]- 4 oz pinto beans, pork, pico, cheese, salsa, roasted vegetables Snk ( PM): fruit or vegetables  D ( PM): 4 oz chicken breast, sauteed in olive oil and spices, mushrooms sauteed in olive oil, carrots and cubed sweet potatoes, iced tea unsweet Snk ( PM): None reported. Beverages: unsweet tea; decaf coffee; water   Usual physical activity: walks 4 times daily   Minutes/Week: 7 days x 8-9 miles: 80 minutes walking and ~1 hour on recumbent bike.   Progress Towards Goal(s):  In progress.   Nutritional Diagnosis:  NB-1.1 Food and nutrition-related knowledge deficit As related to nutrition for prediabetes and hypertension .  As evidenced by pt has questions regarding balancing nutrition for dx of prediabetes and HTN/heart health.    Intervention:  Nutrition counseling provided. Dietitian provided education regarding relationship between prediabetes/insulin resistance and dietary intake. Provided education regarding heart healthy nutrition and benefits of physical activity on heart health and blood sugar. Provided education on carbohydrate counting and ranges for carbohydrate intake as pt reports preferring structure and specific numbers. Also discussed how to reduce/limit sodium intake by viewing nutrition labels. Pt appeared agreeable to information/goals discussed.   Instructions/Goals:   Meal Time Goals:   Make sure to get in three meals per day. Try to have balanced meals like the My Plate example (see handout). Include lean proteins, vegetables, fruits, and whole grains at meals.   For carbohydrates: recommend 3-5 choices per meal (45-75g carbohydrates)   Heart Health:  Continue including heart healthy unsaturated fats and limiting saturated fats (see handout)  Limit foods with 20% or more DV for sodium and saturated fat and include those with 5% or less more often.   Avoid foods with partially hydrogenated oils  Make physical activity a part  of your week. Try to include at least 30 minutes of physical activity 5 days each week or at least 150 minutes per week. Regular physical activity promotes overall health-including helping to reduce risk for heart disease and diabetes, promoting mental health, and helping [place] sleep better.    Continue with including physical activity each day.  Teaching Method Utilized: Visual Auditory  Handouts given during visit include:  Balanced plate and food list.   Yellow Card (carbohydrate counting)  Snack Sheet   Heart Healthy Nutrition   Barriers to learning/adherence to lifestyle change: None indicated.   Demonstrated degree of understanding via:  Teach Back   Monitoring/Evaluation:  Dietary intake, exercise, and body weight in 1 month(s).

## 2019-01-05 NOTE — Patient Instructions (Addendum)
Instructions/Goals:   Meal Time Goals:   Make sure to get in three meals per day. Try to have balanced meals like the My Plate example (see handout). Include lean proteins, vegetables, fruits, and whole grains at meals.   For carbohydrates: recommend 3-5 choices per meal (45-75g carbohydrates)   Heart Health:   Continue including heart healthy unsaturated fats and limiting saturated fats (see handout)  Limit foods with 20% or more DV for sodium and saturated fat and include those with 5% or less more often.   Avoid foods with partially hydrogenated oils  Make physical activity a part of your week. Try to include at least 30 minutes of physical activity 5 days each week or at least 150 minutes per week. Regular physical activity promotes overall health-including helping to reduce risk for heart disease and diabetes, promoting mental health, and helping [place] sleep better.    Continue with including physical activity each day.

## 2019-01-07 ENCOUNTER — Telehealth: Payer: Self-pay | Admitting: Registered"

## 2019-01-07 NOTE — Telephone Encounter (Signed)
On 01/06/2019 dietitian returned pt call from earlier in the day. Pt reports he thought of a couple questions he forgot to ask during appointment on 01/05/19. Pt wants to know if it would be ok for him to include whey protein in his smoothies and if he should choose a certain form of whey protein. Pt also wanted to know how many whole eggs are ok to include per week and whether Quest bars are ok to have. Dietitian discussed types of whey protein and let pt know he can include it in smoothies to increase protein. Discussed that otherwise, if he is including plenty of protein in his meals via whole foods he would not need to add it to other dishes. Discussed checking labels of protein powder for carbohydrates as they sometimes contain carbohydrates to add flavor. Advised recommendations for whole eggs is generally no more than 7 per week for heart health. Discussed that including Quest bars in moderation is fine as pt reports they help quench his craving for something sweet. Informed pt that they do contain artificial sweeteners so would recommend having them in moderation.

## 2019-01-19 ENCOUNTER — Telehealth: Payer: Self-pay | Admitting: Registered"

## 2019-01-19 ENCOUNTER — Encounter: Payer: Self-pay | Admitting: Registered"

## 2019-01-19 NOTE — Telephone Encounter (Signed)
Pt called dietitian r/t multiple questions regarding recommended foods. Pt reports he is now 177 lb and feels he is doing well with following recommendations. Reports blood pressure is down as well. Pt reports he would like to know if it is ok for him to continue including 1 tbsp Coffee Mate creamer per day or if he should switch to half and half and Swerve sugar substitute. Dietitian discussed that he can continue with the creamer if he wants to if only doing tbsp. Pt wants to know if it would be ok for him to put some butter on his vegetables once a week as he reports not liking the taste of margarines and missing the butter taste. Reports he usually uses olive oil the rest of the time. Dietitian advised if doing a small amount of butter one time a week that should be fine to include with pt doing primarily olive oil as fat otherwise. Discussed that reducing saturated fats and replacing with unsaturated fats is beneficial to lowering cholesterol. Dietitian reviewed carbohydrate recommendations for meals with pt as discussed at last visit. Pt wanted to know if he could include whole grain farro risotto which includes a good source of fiber and protein. Dietitian discussed that it is a good grain to include d/t fiber and protein content. Pt reports trying to keep sodium below 1300 mg. Dietitian advised that trying to aim under 1500 mg would still be a low sodium diet. Pt was pleased to hear. Pt wanted to know about if he could sometimes include a lemon bar dessert. Discussed fitting it into recommended carbohydrate range for meals. Discussed contents of multiple snack bars-dietitian advised those with around 1 carbohydrate choice but low in fiber and protein could be included as a dessert balanced with meal. Would recommend those with good source of protein and fiber if doing as a snack. Pt voiced understanding.

## 2019-02-24 DIAGNOSIS — M546 Pain in thoracic spine: Secondary | ICD-10-CM | POA: Diagnosis not present

## 2019-02-24 DIAGNOSIS — R350 Frequency of micturition: Secondary | ICD-10-CM | POA: Diagnosis not present

## 2019-02-24 DIAGNOSIS — R7303 Prediabetes: Secondary | ICD-10-CM | POA: Diagnosis not present

## 2019-03-02 DIAGNOSIS — E785 Hyperlipidemia, unspecified: Secondary | ICD-10-CM | POA: Diagnosis not present

## 2019-03-22 ENCOUNTER — Other Ambulatory Visit: Payer: Self-pay

## 2019-03-22 ENCOUNTER — Encounter (HOSPITAL_BASED_OUTPATIENT_CLINIC_OR_DEPARTMENT_OTHER): Payer: Self-pay

## 2019-03-22 ENCOUNTER — Emergency Department (HOSPITAL_BASED_OUTPATIENT_CLINIC_OR_DEPARTMENT_OTHER)
Admission: EM | Admit: 2019-03-22 | Discharge: 2019-03-22 | Disposition: A | Discharge status: Home / Self Care | Payer: Medicare Other | Attending: Emergency Medicine | Admitting: Emergency Medicine

## 2019-03-22 DIAGNOSIS — Y929 Unspecified place or not applicable: Secondary | ICD-10-CM | POA: Diagnosis not present

## 2019-03-22 DIAGNOSIS — Y9389 Activity, other specified: Secondary | ICD-10-CM | POA: Insufficient documentation

## 2019-03-22 DIAGNOSIS — I10 Essential (primary) hypertension: Secondary | ICD-10-CM | POA: Diagnosis not present

## 2019-03-22 DIAGNOSIS — S1011XA Abrasion of throat, initial encounter: Secondary | ICD-10-CM | POA: Diagnosis not present

## 2019-03-22 DIAGNOSIS — R350 Frequency of micturition: Secondary | ICD-10-CM

## 2019-03-22 DIAGNOSIS — Z888 Allergy status to other drugs, medicaments and biological substances status: Secondary | ICD-10-CM | POA: Diagnosis not present

## 2019-03-22 DIAGNOSIS — E785 Hyperlipidemia, unspecified: Secondary | ICD-10-CM | POA: Insufficient documentation

## 2019-03-22 DIAGNOSIS — Z880 Allergy status to penicillin: Secondary | ICD-10-CM | POA: Insufficient documentation

## 2019-03-22 DIAGNOSIS — X58XXXA Exposure to other specified factors, initial encounter: Secondary | ICD-10-CM | POA: Diagnosis not present

## 2019-03-22 DIAGNOSIS — Z79899 Other long term (current) drug therapy: Secondary | ICD-10-CM | POA: Diagnosis not present

## 2019-03-22 DIAGNOSIS — Y999 Unspecified external cause status: Secondary | ICD-10-CM | POA: Insufficient documentation

## 2019-03-22 LAB — URINALYSIS, ROUTINE W REFLEX MICROSCOPIC
Bilirubin Urine: NEGATIVE
Glucose, UA: NEGATIVE mg/dL
Hgb urine dipstick: NEGATIVE
Ketones, ur: NEGATIVE mg/dL
Leukocytes,Ua: NEGATIVE
Nitrite: NEGATIVE
Protein, ur: NEGATIVE mg/dL
Specific Gravity, Urine: 1.01 (ref 1.005–1.030)
pH: 7 (ref 5.0–8.0)

## 2019-03-22 MED ORDER — BENZOCAINE 20 % MT AERO
INHALATION_SPRAY | Freq: Once | OROMUCOSAL | Status: AC
  Administered 2019-03-22: 11:00:00 1 via OROMUCOSAL

## 2019-03-22 MED ORDER — BENZOCAINE 20 % MT AERO
INHALATION_SPRAY | OROMUCOSAL | Status: AC
  Administered 2019-03-22: 1 via OROMUCOSAL
  Filled 2019-03-22: qty 57

## 2019-03-22 MED ORDER — BUTAMBEN-TETRACAINE-BENZOCAINE 2-2-14 % EX AERO
1.0000 | INHALATION_SPRAY | Freq: Once | CUTANEOUS | Status: DC
  Filled 2019-03-22: qty 20

## 2019-03-22 NOTE — ED Provider Notes (Signed)
[facility] EMERGENCY DEPARTMENT Provider Note   CSN: [id number] Arrival date & time: 03/22/19  0957     History   Chief Complaint No chief complaint on file.   HPI Walter Freeman is a 68 y.o. male with a past medical history of anxiety, reflux, hyperlipidemia, hypertension who presents the emergency department for evaluation of his throat.  The patient states that yesterday he was eating some walnuts when he felt a sharp pain on the right posterior pharynx acute swallowed part of the shell.  He was concerned he might have lodged the night into his tonsil and he began coughing hard and states that he coughed up a little spray of blood.  He states that it seemed to resolve.  He had some soreness in the back of his throat.  This morning when he first got up he coughed and noticed that his sputum was tinged with blood.  He is concerned that he still might have a foreign body and came to the emergency department for evaluation.  He denies shortness of breath, fevers, chills, continued bleeding. Patient also c/o increased urinary frequency. Denies polyuria, fever, chills, flank pain, vomiting, hematuria.     The history is provided by the patient.  Sore Throat    Past Medical History:  Diagnosis Date  . Anxiety   . Diverticulitis   . GERD (gastroesophageal reflux disease)   . Hyperlipidemia   . Hypertension   . IBS (irritable bowel syndrome)   . Paresthesias     Patient Active Problem List   Diagnosis Date Noted  . Perforated diverticulum of large intestine 07/12/2015    Past Surgical History:  Procedure Laterality Date  . ABDOMINAL SURGERY    . EYE SURGERY     fracture repair  . INGUINAL HERNIA REPAIR Right 08/2015        Home Medications    Prior to Admission medications   Medication Sig Start Date End Date Taking? Authorizing Provider  acetaminophen (TYLENOL 8 HOUR) 650 MG CR tablet Take 1 tablet (650 mg total) by mouth every 8 (eight) hours as needed.  10/23/18   Derwood Kaplan, MD  amLODipine (NORVASC) 10 MG tablet Take 10 mg by mouth daily.    [provider]  hydrochlorothiazide (HYDRODIURIL) 25 MG tablet Take 25 mg by mouth every morning. 08/21/18   [provider]  ibuprofen (ADVIL) 400 MG tablet Take 1 tablet (400 mg total) by mouth every 6 (six) hours as needed. 10/23/18   Derwood Kaplan, MD  Lidocaine 2 % GEL Apply 5 mLs topically 3 (three) times daily as needed. 10/23/18   Derwood Kaplan, MD  tadalafil (CIALIS) 20 MG tablet Take 20 mg by mouth daily as needed for erectile dysfunction.    [provider]    Family History Family History  Problem Relation Age of Onset  . Diverticulitis Mother   . Lung cancer Sister   . COPD Sister     Social History Social History   Tobacco Use  . Smoking status: Never Smoker  . Smokeless tobacco: Never Used  Substance Use Topics  . Alcohol use: No  . Drug use: No     Allergies   Lisinopril, Other, and Penicillins   Review of Systems Review of Systems  Constitutional: Negative for appetite change, chills and fever.  HENT: Positive for postnasal drip and sore throat. Negative for trouble swallowing and voice change.   Respiratory: Negative for cough.   Gastrointestinal: Negative for nausea and vomiting.  Endocrine: Negative for polyuria.  Genitourinary: Negative for flank pain.  Musculoskeletal: Negative for back pain.  Skin: Negative.   Neurological: Negative for dizziness.  Hematological: Does not bruise/bleed easily.      Physical Exam Updated Vital Signs There were no vitals taken for this visit.  Physical Exam Vitals signs and nursing note reviewed.  Constitutional:      General: He is not in acute distress.    Appearance: He is well-developed. He is not diaphoretic.  HENT:     Head: Normocephalic and atraumatic.     Mouth/Throat:     Mouth: Mucous membranes are moist.   Eyes:     General: No scleral icterus.    Conjunctiva/sclera:  Conjunctivae normal.  Neck:     Musculoskeletal: Normal range of motion and neck supple.  Cardiovascular:     Rate and Rhythm: Normal rate and regular rhythm.     Heart sounds: Normal heart sounds.  Pulmonary:     Effort: Pulmonary effort is normal. No respiratory distress.     Breath sounds: Normal breath sounds.  Abdominal:     Palpations: Abdomen is soft.     Tenderness: There is no abdominal tenderness.  Skin:    General: Skin is warm and dry.  Neurological:     Mental Status: He is alert.  Psychiatric:        Behavior: Behavior normal.      ED Treatments / Results  Labs (all labs ordered are listed, but only abnormal results are displayed) Labs Reviewed - No data to display  EKG None  Radiology No results found.  Procedures Procedures (including critical care time)  Medications Ordered in ED Medications - No data to display   Initial Impression / Assessment and Plan / ED Course  I have reviewed the triage vital signs and the nursing notes.  Pertinent labs & imaging results that were available during my care of the patient were reviewed by me and considered in my medical decision making (see chart for details).        Patient with small abrasion to the right posterior oropharynx.  Urine is negative for infection on my interpretation.  There are no signs of infection.   No signs of retained foreign object.  No active bleeding at this time.  Discussed outpatient care and return precautions with the patient appears appropriate for discharge.    Final Clinical Impressions(s) / ED Diagnoses   Final diagnoses:  Abrasion of pharynx, initial encounter  Urinary frequency    ED Discharge Orders    None       Margarita Mail, PA-C 03/22/19 [place], [place], DO 03/22/19 1521

## 2019-03-22 NOTE — ED Triage Notes (Signed)
Pt reports that he was eating nuts yesterday and felt like one got caught. He states he attempted to cough hard to dislodge it and coughed up bright blood. Pt reports this morning he coughed up some blood tinged sputum. Pt denies being on a blood thinner.

## 2019-03-22 NOTE — ED Notes (Signed)
ED Provider at bedside. 

## 2019-03-22 NOTE — Discharge Instructions (Addendum)
Use frequent salt water gargles.  Contact a physician if you were symptoms or pain are worsening.  Return immediately to the emergency department if he have change in voice, severe swelling of the throat, fever, or hemorrhage.

## 2019-07-08 ENCOUNTER — Ambulatory Visit: Payer: Self-pay | Admitting: General Surgery

## 2019-07-08 NOTE — H&P (Signed)
Joycelyn Man Documented: 07/07/2019 2:04 PM Location: [facility] Patient #: [id number] DOB: 1950-12-15 Widowed / Language: [place] / Race: White Male  History of Present Illness Minerva Areola M. Ellionna Buckbee MD; 07/08/2019 3:39 PM) The patient is a 69 year old male who presents with an inguinal hernia. Patient comes in today to discuss a right inguinal hernia. He previously had underwent an open repair many years ago when he was age 36 or 70. He states that they used mesh. He is established with our practice because a few years ago he was medically managed for complicated diverticulitis. He describes what sounds like perforated sigmoid diverticulitis for which she underwent laparoscopic washout and drain placement. He recovered from that but had a recurrence of diverticulitis a few months later and was admitted to our hospital system. He underwent a percutaneous drain placement and IV antibiotics and ultimately resolved. He states that he has anxiety when he has to deal with medical issues or medical symptoms and proceeded to tell me in great detail his diverticulitis hospitalization and experiences. He states that he had a colonoscopy after being treated for diverticulitis and this showed no masses. He states that he noticed a lump on his right side in his groin a few months ago. Sometimes it sore and more pronounced than other times. He is not sure if he caused it when he was trying to be more physically active in order to improve some health issues. He states that he did gain some weight and was actively trying to lose weight to improve his blood pressure and his A1c and is not sure if his increased physical activity resulted in recurrent hernia. He also states that he does have a difficult time passing stool that he has some constipation. He denies any melena or hematochezia. 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 He does have some nocturia and BPH. He describes his discomfort in his  right groin as a dull ache. He denies any burning or stabbing sensation. He states that he did have a CT scan at some point during his hospitalizations for esophagitis and was told he had bilateral fat-containing inguinal hernias. However he has not noticed anything on the left.  I reviewed [facility]. He underwent laparoscopic lysis of adhesions and washout.  I also reviewed his last CT abdomen and pelvis personally from February 2018. At one point during his prior hospitalizations there is some concern for enteric fistulas between the colon and the small bowel but there is no evidence of that on the last CT scan. His prior diverticulitis that had abscesses in the pelvis.  I also reviewed Dr. Abbey Chatters last clinic note from our office  He denies any chest pain, chest pressure, source of breath, orthopnea, paroxysmal internal dyspnea, TIAs or amaurosis fugax. He does have some erectile dysfunction.   Problem List/Past Medical Minerva Areola M. Andrey Campanile, MD; 07/08/2019 3:36 PM) RECURRENT RIGHT INGUINAL HERNIA (K40.91) BPH ASSOCIATED WITH NOCTURIA (N40.1) HISTORY OF DIVERTICULITIS OF COLON (Z87.19)  Past Surgical History (Chanel Lonni Fix, CMA; 07/07/2019 2:05 PM) Open Inguinal Hernia Surgery Right. Oral Surgery  Diagnostic Studies History Minerva Areola M. Andrey Campanile, MD; 07/08/2019 3:36 PM) Colonoscopy 5-10 years ago  Allergies (Chanel Lonni Fix, CMA; 07/07/2019 2:06 PM) Lisinopril *ANTIHYPERTENSIVES* Anaphylaxis. Penicillin V *PENICILLINS* Swelling, Rash. Allergies Reconciled  Medication History Minerva Areola M. Andrey Campanile, MD; 07/08/2019 3:36 PM) Tamsulosin HCl (0.4MG  Capsule, 1 (one) Oral daily, Taken starting 07/07/2019) Active. Hydrocodone-Acetaminophen (5-500MG  Capsule, Oral as needed) Active. AmLODIPine  Besylate (10MG  Tablet, Oral) Active. ALPRAZolam (0.25MG  Tablet, Oral as needed) Active. Omeprazole (20MG  Capsule DR, Oral) Active.  Social History Randall Hiss M.  Redmond Pulling, MD; 07/08/2019 3:36 PM) Caffeine use Tea. No drug use Tobacco use Never smoker. No alcohol use  Family History Randall Hiss M. Redmond Pulling, MD; 07/08/2019 3:36 PM) Depression Sister. Hypertension Family Members In General. Ischemic Bowel Disease Family Members In General, Mother. Respiratory Condition Mother. Colon Polyps Sister.  Other Problems Randall Hiss M. Redmond Pulling, MD; 07/08/2019 3:36 PM) Anxiety Disorder Diverticulosis Heart murmur High blood pressure Inguinal Hernia Other disease, cancer, significant illness Gastroesophageal Reflux Disease Hemorrhoids Hypercholesterolemia     Review of Systems (Chanel Nolan CMA; 07/07/2019 2:05 PM) General Present- Fatigue. Not Present- Appetite Loss, Chills, Fever, Night Sweats, Weight Gain and Weight Loss. Skin Not Present- Change in Wart/Mole, Dryness, Hives, Jaundice, New Lesions, Non-Healing Wounds, Rash and Ulcer. HEENT Present- Ringing in the Ears and Wears glasses/contact lenses. Not Present- Earache, Hearing Loss, Hoarseness, Nose Bleed, Oral Ulcers, Seasonal Allergies, Sinus Pain, Sore Throat, Visual Disturbances and Yellow Eyes. Gastrointestinal Present- Bloating and Hemorrhoids. Not Present- Abdominal Pain, Bloody Stool, Change in Bowel Habits, Chronic diarrhea, Constipation, Difficulty Swallowing, Excessive gas, Gets full quickly at meals, Indigestion, Nausea, Rectal Pain and Vomiting. Neurological Not Present- Decreased Memory, Fainting, Headaches, Numbness, Seizures, Tingling, Tremor, Trouble walking and Weakness. Psychiatric Present- Anxiety. Not Present- Bipolar, Change in Sleep Pattern, Depression, Fearful and Frequent crying. Hematology Not Present- Blood Thinners, Easy Bruising, Excessive bleeding, Gland problems, HIV and Persistent Infections.  Vitals (Chanel Nolan CMA; 07/07/2019 2:06 PM) 07/07/2019 2:06 PM Weight: 184.25 lb Height: 71in Body Surface Area: 2.04 m Body Mass Index: 25.7 kg/m  Temp.: 98.4F   Pulse: 77 (Regular)  BP: 134/78 (Sitting, Left Arm, Standard)        Physical Exam Randall Hiss M. Tasha Jindra MD; 07/08/2019 3:20 PM)  General Mental Status-Alert. General Appearance-Consistent with stated age. Hydration-Well hydrated. Voice-Normal.  Head and Neck Head-normocephalic, atraumatic with no lesions or palpable masses. Trachea-midline. Thyroid Gland Characteristics - normal size and consistency.  Eye Eyeball - Bilateral-Extraocular movements intact. Sclera/Conjunctiva - Bilateral-No scleral icterus.  Chest and Lung Exam Chest and lung exam reveals -quiet, even and easy respiratory effort with no use of accessory muscles and on auscultation, normal breath sounds, no adventitious sounds and normal vocal resonance. Inspection Chest Wall - Normal. Back - normal.  Breast - Did not examine.  Cardiovascular Cardiovascular examination reveals -normal heart sounds, regular rate and rhythm with no murmurs and normal pedal pulses bilaterally.  Abdomen Inspection Inspection of the abdomen reveals - No Hernias. Skin - Scar - Note: old drain scars- one in lower midline. old trocar scars. Palpation/Percussion Palpation and Percussion of the abdomen reveal - Soft, Non Tender, No Rebound tenderness, No Rigidity (guarding) and No hepatosplenomegaly. Auscultation Auscultation of the abdomen reveals - Bowel sounds normal.  Male Genitourinary Note: old right inguinal incision; bulge in rt inguinal canal reducible. no bulge in L canal with valsalva mvmts. both testicles down.  Peripheral Vascular Upper Extremity Palpation - Pulses bilaterally normal.  Neurologic Neurologic evaluation reveals -alert and oriented x 3 with no impairment of recent or remote memory. Mental Status-Normal.  Neuropsychiatric The patient's mood and affect are described as -normal. Judgment and Insight-insight is appropriate concerning matters relevant to  self.  Musculoskeletal Normal Exam - Left-Upper Extremity Strength Normal and Lower Extremity Strength Normal. Normal Exam - Right-Upper Extremity Strength Normal and Lower Extremity Strength Normal.  Lymphatic Head & Neck  General Head & Neck Lymphatics: Bilateral -  Description - Normal. Axillary - Did not examine. Femoral & Inguinal - Did not examine.    Assessment & Plan Minerva Areola M. Jahzion Brogden MD; 07/08/2019 3:41 PM)  RECURRENT RIGHT INGUINAL HERNIA (K40.91) Story: This patient encounter took 47 minutes today to perform the following: take history, perform exam, review outside records, interpret imaging, counsel the patient on their diagnosis. While this is not a moderately complex problem I spent a significant amount of time with the patient today given his medical anxiety and to allow him time to explain his medical history in detail at his request, answer all of his questions, explained rationale for general anesthesia, discussed pros and cons of observation versus proceeding with surgery Impression: We discussed the etiology of inguinal hernias. We discussed the signs & symptoms of incarceration & strangulation. We discussed non-operative and operative management.  We discussed the laparoscopic approach  I described the procedure in detail. The patient was given educational material. We discussed the risks and benefits including but not limited to bleeding, infection, chronic inguinal pain, nerve entrapment, hernia recurrence, mesh complications, hematoma formation, urinary retention, injury to the testicle, numbness in the groin, blood clots, injury to the surrounding structures, and anesthesia risk. We also discussed the typical post operative recovery course, including no heavy lifting for 4-6 weeks. I explained that the likelihood of improvement of their symptoms is good.  I don't appreciate a left inguinal hernia on exam. He is not interested in a bilateral repair even if it is  present. We did discuss at length the given his prior pelvic infections that he may have significant adhesions of his viscera to his right lower quadrant abdominal wall which would make laparoscopic approach higher risk. If intraoperatively we found dense adhesions in the right lower quadrant I would recommend switching to an open procedure and I explained the rationale for this. He understands and agrees with this. He is going to think about whether or not he wants to proceed with surgery given his medical anxiety and call our office and let us know.  We did discuss that he is at higher risk for recurrent hernia, nerve injury-chronic pain since this is a recurrent hernia and we may have to do an open approach depending on intraoperative findings  Current Plans Pt Education - Pamphlet Given - Laparoscopic Hernia Repair: discussed with patient and provided information. You are being scheduled for surgery- Our schedulers will call you.  You should hear from our office's scheduling department within 5 working days about the location, date, and time of surgery. We try to make accommodations for patient's preferences in scheduling surgery, but sometimes the OR schedule or the surgeon's schedule prevents [place] from making those accommodations.  If you have not heard from our office [phone]) in 5 working days, call the office and ask for your surgeon's nurse.  If you have other questions about your diagnosis, plan, or surgery, call the office and ask for your surgeon's nurse.   BPH ASSOCIATED WITH NOCTURIA (N40.1) Impression: I'll place him on perioperative Flomax to try to decrease his risk of urinary retention given what sounds like some BPH symptoms  Current Plans Started Tamsulosin HCl 0.4 MG Oral Capsule, 1 (one) Capsule daily, #30, 07/07/2019, Ref. x1.  HISTORY OF DIVERTICULITIS OF COLON (Z87.19) Impression: 2017 prior complicated diverticulitis - lap washout at OSH and later subsequent  perc drain.  Mary Sella. Andrey Campanile, MD, FACS General, Bariatric, & Minimally Invasive Surgery [facility], [place]

## 2019-08-22 ENCOUNTER — Other Ambulatory Visit (HOSPITAL_COMMUNITY): Payer: Medicare Other

## 2019-08-25 ENCOUNTER — Encounter (HOSPITAL_BASED_OUTPATIENT_CLINIC_OR_DEPARTMENT_OTHER): Payer: Self-pay | Admitting: General Surgery

## 2019-08-25 ENCOUNTER — Other Ambulatory Visit: Payer: Self-pay

## 2019-08-29 ENCOUNTER — Other Ambulatory Visit (HOSPITAL_COMMUNITY): Payer: Medicare Other

## 2019-08-31 NOTE — Progress Notes (Signed)
Contacted Walter Freeman this am regarding needing covid test . Patient states it is not a good time for surgery for him and is canceling surgery. Patient states he has contacted Dr Tawana Scale office and notified them. Steward Drone at Dr Tawana Scale office was called after speaking with patient and notified.

## 2019-09-01 ENCOUNTER — Ambulatory Visit (HOSPITAL_BASED_OUTPATIENT_CLINIC_OR_DEPARTMENT_OTHER)
Admission: RE | Admit: 2019-09-01 | Payer: Medicare (Managed Care) | Source: Home / Self Care | Admitting: General Surgery

## 2019-09-01 SURGERY — REPAIR, HERNIA, INGUINAL, LAPAROSCOPIC
Anesthesia: General | Laterality: Right

## 2019-10-31 NOTE — Progress Notes (Signed)
DUE TO COVID-19 ONLY ONE VISITOR IS ALLOWED TO COME WITH YOU AND STAY IN THE WAITING ROOM ONLY DURING PRE OP AND PROCEDURE DAY OF SURGERY. THE 1 VISITOR MAY VISIT WITH YOU AFTER SURGERY IN YOUR PRIVATE ROOM DURING VISITING HOURS ONLY!  YOU NEED TO HAVE A COVID 19 TEST ON_______ @_______ , THIS TEST MUST BE DONE BEFORE SURGERY, COME  [address], [place] , .  [facility]) ONCE YOUR COVID TEST IS COMPLETED, PLEASE BEGIN THE QUARANTINE INSTRUCTIONS AS OUTLINED IN YOUR HANDOUT.                ROBERTS BON  10/31/2019   Your procedure is scheduled on:  11/07/2019   Report to [facility] Main  Entrance   Report to admitting at   1130 AM     Call this number if you have problems the morning of surgery [phone]    Remember: Do not eat food    :After Midnight. BRUSH YOUR TEETH MORNING OF SURGERY AND RINSE YOUR MOUTH OUT, NO CHEWING GUM CANDY OR MINTS.     Take these medicines the morning of surgery with A SIP OF WATER:  Amlodipine ( if take in am ) , Prilosec ( if take in am )                                 You may not have any metal on your body including hair pins and              piercings  Do not wear jewelry, make-up, lotions, powders or perfumes, deodorant             Do not wear nail polish on your fingernails.  Do not shave  48 hours prior to surgery.              Men may shave face and neck.   Do not bring valuables to the hospital. [facility] IS NOT             RESPONSIBLE   FOR VALUABLES.  Contacts, dentures or bridgework may not be worn into surgery.  Leave suitcase in the car. After surgery it may be brought to your room.     Patients discharged the day of surgery will not be allowed to drive home. IF YOU ARE HAVING SURGERY AND GOING HOME THE SAME DAY, YOU MUST HAVE AN ADULT TO DRIVE YOU HOME AND BE WITH YOU FOR 24 HOURS. YOU MAY GO HOME BY TAXI OR UBER OR ORTHERWISE, BUT AN ADULT MUST ACCOMPANY YOU HOME AND STAY WITH YOU FOR 24  HOURS.  Name and phone number of your driver:               Please read over the following fact sheets you were given: _____________________________________________________________________             NO SOLID FOOD AFTER MIDNIGHT THE NIGHT PRIOR TO SURGERY. NOTHING BY MOUTH EXCEPT CLEAR LIQUIDS UNTIL   1030am . PLEASE FINISH ENSURE DRINK PER SURGEON ORDER  WHICH NEEDS TO BE COMPLETED AT .1030am   CLEAR LIQUID DIET   Foods Allowed  Coffee and tea, regular and decaf                            Plain Jell-O any favor except red or purple                                             Fruit ices (not with fruit pulp)                                      Iced Popsicles                                Carbonated beverages, regular and diet                                    Cranberry, grape and apple juices Sports drinks like Gatorade Lightly seasoned clear broth or consume(fat free) Sugar, honey syrup                                                                        _____________________________________________________________________  [facility] - Preparing for Surgery Before surgery, you can play an important role.  Because skin is not sterile, your skin needs to be as free of germs as possible.  You can reduce the number of germs on your skin by washing with CHG (chlorahexidine gluconate) soap before surgery.  CHG is an antiseptic cleaner which kills germs and bonds with the skin to continue killing germs even after washing. Please DO NOT use if you have an allergy to CHG or antibacterial soaps.  If your skin becomes reddened/irritated stop using the CHG and inform your nurse when you arrive at Short Stay. Do not shave (including legs and underarms) for at least 48 hours prior to the first CHG shower.  You may shave your face/neck. Please follow these instructions carefully:  1.  Shower with CHG Soap the night before surgery and  the  morning of Surgery.  2.  If you choose to wash your hair, wash your hair first as usual with your  normal  shampoo.  3.  After you shampoo, rinse your hair and body thoroughly to remove the  shampoo.                           4.  Use CHG as you would any other liquid soap.  You can apply chg directly  to the skin and wash                       Gently with a scrungie or clean washcloth.  5.  Apply the CHG Soap to your body ONLY FROM THE NECK DOWN.   Do not use on face/ open  Wound or open sores. Avoid contact with eyes, ears mouth and genitals (private parts).                       Wash face,  Genitals (private parts) with your normal soap.             6.  Wash thoroughly, paying special attention to the area where your surgery  will be performed.  7.  Thoroughly rinse your body with warm water from the neck down.  8.  DO NOT shower/wash with your normal soap after using and rinsing off  the CHG Soap.                9.  Pat yourself dry with a clean towel.            10.  Wear clean pajamas.            11.  Place clean sheets on your bed the night of your first shower and do not  sleep with pets. Day of Surgery : Do not apply any lotions/deodorants the morning of surgery.  Please wear clean clothes to the hospital/surgery center.  FAILURE TO FOLLOW THESE INSTRUCTIONS MAY RESULT IN THE CANCELLATION OF YOUR SURGERY PATIENT SIGNATURE_________________________________  NURSE SIGNATURE__________________________________  ________________________________________________________________________

## 2019-11-01 ENCOUNTER — Encounter (HOSPITAL_COMMUNITY): Payer: Self-pay | Admitting: Anesthesiology

## 2019-11-01 NOTE — Anesthesia Preprocedure Evaluation (Deleted)
Anesthesia Evaluation    Airway        Dental   Pulmonary           Cardiovascular hypertension,      Neuro/Psych    GI/Hepatic   Endo/Other    Renal/GU      Musculoskeletal   Abdominal   Peds  Hematology   Anesthesia Other Findings   Reproductive/Obstetrics                             Anesthesia Physical Anesthesia Plan  ASA:   Anesthesia Plan:    Post-op Pain Management:    Induction:   PONV Risk Score and Plan:   Airway Management Planned:   Additional Equipment:   Intra-op Plan:   Post-operative Plan:   Informed Consent:   Plan Discussed with:   Anesthesia Plan Comments: (Extended discussion by phone ( equested by Dr. Andrey Campanile) due to patients  concerns for post operative pain and discomfort.  I have reassured him that we will provide a TAP block and treat him in the recovery room if needed to manage any issues.  Harlon Ditty MD)        Anesthesia Quick Evaluation

## 2019-11-03 ENCOUNTER — Inpatient Hospital Stay (HOSPITAL_COMMUNITY)
Admission: RE | Admit: 2019-11-03 | Discharge: 2019-11-03 | Disposition: A | Discharge status: Home / Self Care | Payer: Medicare (Managed Care) | Source: Ambulatory Visit

## 2019-11-03 ENCOUNTER — Other Ambulatory Visit (HOSPITAL_COMMUNITY): Payer: Medicare (Managed Care)

## 2019-11-03 NOTE — Progress Notes (Signed)
Patient was a no show for Covid testin on 7/22 and a no show for preop lab appt on 7/22 also  Called and [facility] for pt..  Also called the office of [facility] and they will inform Surgery Scheduler of above.

## 2019-11-03 NOTE — Progress Notes (Signed)
PT [facility] stating he had been around a group of people who have tested positive for COVID and felt like he should self isolate.  Instructed pt to call office of [facility] at [phone] and let them be aware.  Patient voiced understanding.

## 2019-11-04 NOTE — Progress Notes (Signed)
Called [facility] and spoke with Sheralyn Boatman , surgery Scheduler.  Steward Drone stated that pt had called the office on 11/03/2019 regarding Covid exposure and he would be cancelled.  No need for [facility] to further call pt per Sheralyn Boatman.

## 2019-11-15 ENCOUNTER — Ambulatory Visit: Payer: Medicare (Managed Care) | Attending: Internal Medicine

## 2019-11-15 DIAGNOSIS — Z23 Encounter for immunization: Secondary | ICD-10-CM

## 2019-11-15 NOTE — Progress Notes (Signed)
   Covid-19 Vaccination Clinic  Name:  Walter Freeman    MRN: [id number] DOB: 1950-07-10  11/15/2019  [name] was observed post Covid-19 immunization for 30 minutes based on pre-vaccination screening without incident. He was provided with Vaccine Information Sheet and instruction to access the V-Safe system.   [name] was instructed to call 911 with any severe reactions post vaccine: Marland Kitchen Difficulty breathing  . Swelling of face and throat  . A fast heartbeat  . A bad rash all over body  . Dizziness and weakness   Immunizations Administered    Name Date Dose VIS Date Route   Moderna COVID-19 Vaccine 11/15/2019 12:13 PM 0.5 mL 03/2019 Intramuscular   Manufacturer: Moderna   Lot: [id number]   NDC: 93790-240-97

## 2019-12-13 ENCOUNTER — Ambulatory Visit: Payer: Medicare (Managed Care) | Attending: Internal Medicine

## 2019-12-13 DIAGNOSIS — Z23 Encounter for immunization: Secondary | ICD-10-CM

## 2019-12-13 NOTE — Progress Notes (Signed)
   Covid-19 Vaccination Clinic  Name:  Walter Freeman    MRN: [id number] DOB: 11-24-1950  12/13/2019  Walter Freeman was observed post Covid-19 immunization for 30 minutes based on pre-vaccination screening without incident. He was provided with Vaccine Information Sheet and instruction to access the V-Safe system.   Walter Freeman was instructed to call 911 with any severe reactions post vaccine: Marland Kitchen Difficulty breathing  . Swelling of face and throat  . A fast heartbeat  . A bad rash all over body  . Dizziness and weakness   Immunizations Administered    Name Date Dose VIS Date Route   Moderna COVID-19 Vaccine 12/13/2019  2:46 PM 0.5 mL 03/2019 Intramuscular   Manufacturer: Moderna   Lot: [id number]   NDC: 34196-222-97

## 2019-12-15 NOTE — Patient Instructions (Addendum)
DUE TO COVID-19 ONLY ONE VISITOR IS ALLOWED TO COME WITH YOU AND STAY IN THE WAITING ROOM ONLY DURING PRE OP AND PROCEDURE DAY OF SURGERY. THE 1 VISITOR  MAY VISIT WITH YOU AFTER SURGERY IN YOUR PRIVATE ROOM DURING VISITING HOURS ONLY!  YOU NEED TO HAVE A COVID 19 TEST ON_9/13______ @_11 :30______, THIS TEST MUST BE DONE BEFORE SURGERY,  COVID TESTING SITE [address] [place]  , IT IS ON THE RIGHT GOING OUT [address] APPROXIMATELY  2 MINUTES PAST [employer] ON THE RIGHT. ONCE YOUR COVID TEST IS COMPLETED,  PLEASE BEGIN THE QUARANTINE INSTRUCTIONS AS OUTLINED IN YOUR HANDOUT.                [id number]    Your procedure is scheduled on: 12/29/19   Report to [facility] Main  Entrance   Report to admitting at  7:30 AM     Call this number if you have problems the morning of surgery [phone]    BRUSH YOUR TEETH MORNING OF SURGERY AND RINSE YOUR MOUTH OUT, NO CHEWING GUM CANDY OR MINTS.  No food after midnight.    You may have clear liquid until 6:30 AM.    At 6:30 AM drink pre surgery drink.   Nothing by mouth after 6:30 AM.    Take these medicines the morning of surgery with A SIP OF WATER: Amlodipine, Omeprizole, Xanax if needed                                 You may not have any metal on your body including              piercings  Do not wear jewelry,  lotions, powders or deodorant                         Men may shave face and neck.   Do not bring valuables to the hospital. [facility] IS NOT             RESPONSIBLE   FOR VALUABLES.  Contacts, dentures or bridgework may not be worn into surgery.       Patients discharged the day of surgery will not be allowed to drive home.   IF YOU ARE HAVING SURGERY AND GOING HOME THE SAME DAY, YOU MUST HAVE AN ADULT TO DRIVE YOU HOME AND BE WITH YOU FOR 24 HOURS.  YOU MAY GO HOME BY TAXI OR UBER OR ORTHERWISE, BUT AN ADULT MUST ACCOMPANY YOU HOME AND STAY WITH YOU FOR 24  HOURS.  Name and phone number of your driver:  Special Instructions: N/A              Please read over the following fact sheets you were given: _____________________________________________________________________             [facility] - Preparing for Surgery  Before surgery, you can play an important role.   Because skin is not sterile, your skin needs to be as free of germs as possible.   You can reduce the number of germs on your skin by washing with CHG (chlorahexidine gluconate) soap before surgery.   CHG is an antiseptic cleaner which kills germs and bonds with the skin to continue killing germs even after washing. Please DO NOT use if you have an allergy to CHG or antibacterial soaps.   If your  skin becomes reddened/irritated stop using the CHG and inform your nurse when you arrive at Short Stay. .  You may shave your face/neck.  Please follow these instructions carefully:  1.  Shower with CHG Soap the night before surgery and the  morning of Surgery.  2.  If you choose to wash your hair, wash your hair first as usual with your  normal  shampoo.  3.  After you shampoo, rinse your hair and body thoroughly to remove the  shampoo.                                        4.  Use CHG as you would any other liquid soap.  You can apply chg directly  to the skin and wash                       Gently with a scrungie or clean washcloth.  5.  Apply the CHG Soap to your body ONLY FROM THE NECK DOWN.   Do not use on face/ open                           Wound or open sores. Avoid contact with eyes, ears mouth and genitals (private parts).                       Wash face,  Genitals (private parts) with your normal soap.             6.  Wash thoroughly, paying special attention to the area where your surgery  will be performed.  7.  Thoroughly rinse your body with warm water from the neck down.  8.  DO NOT shower/wash with your normal soap after using and rinsing off  the CHG Soap.              9.  Pat yourself dry with a clean towel.            10.  Wear clean pajamas.            11.  Place clean sheets on your bed the night of your first shower and do not  sleep with pets. Day of Surgery : Do not apply any lotions/deodorants the morning of surgery.  Please wear clean clothes to the hospital/surgery center.  FAILURE TO FOLLOW THESE INSTRUCTIONS MAY RESULT IN THE CANCELLATION OF YOUR SURGERY PATIENT SIGNATURE_________________________________  NURSE SIGNATURE__________________________________  ________________________________________________________________________   Walter Freeman  An incentive spirometer is a tool that can help keep your lungs clear and active. This tool measures how well you are filling your lungs with each breath. Taking long deep breaths may help reverse or decrease the chance of developing breathing (pulmonary) problems (especially infection) following:  A long period of time when you are unable to move or be active. BEFORE THE PROCEDURE   If the spirometer includes an indicator to show your best effort, your nurse or respiratory therapist will set it to a desired goal.  If possible, sit up straight or lean slightly forward. Try not to slouch.  Hold the incentive spirometer in an upright position. INSTRUCTIONS FOR USE  1. Sit on the edge of your bed if possible, or sit up as far as you can in bed or on a chair. 2. Hold the incentive spirometer in an  upright position. 3. Breathe out normally. 4. Place the mouthpiece in your mouth and seal your lips tightly around it. 5. Breathe in slowly and as deeply as possible, raising the piston or the ball toward the top of the column. 6. Hold your breath for 3-5 seconds or for as long as possible. Allow the piston or ball to fall to the bottom of the column. 7. Remove the mouthpiece from your mouth and breathe out normally. 8. Rest for a few seconds and repeat Steps 1 through 7 at least 10 times every 1-2  hours when you are awake. Take your time and take a few normal breaths between deep breaths. 9. The spirometer may include an indicator to show your best effort. Use the indicator as a goal to work toward during each repetition. 10. After each set of 10 deep breaths, practice coughing to be sure your lungs are clear. If you have an incision (the cut made at the time of surgery), support your incision when coughing by placing a pillow or rolled up towels firmly against it. Once you are able to get out of bed, walk around indoors and cough well. You may stop using the incentive spirometer when instructed by your caregiver.  RISKS AND COMPLICATIONS  Take your time so you do not get dizzy or light-headed.  If you are in pain, you may need to take or ask for pain medication before doing incentive spirometry. It is harder to take a deep breath if you are having pain. AFTER USE  Rest and breathe slowly and easily.  It can be helpful to keep track of a log of your progress. Your caregiver can provide you with a simple table to help with this. If you are using the spirometer at home, follow these instructions: SEEK MEDICAL CARE IF:   You are having difficultly using the spirometer.  You have trouble using the spirometer as often as instructed.  Your pain medication is not giving enough relief while using the spirometer.  You develop fever of 100.5 F (38.1 C) or higher. SEEK IMMEDIATE MEDICAL CARE IF:   You cough up bloody sputum that had not been present before.  You develop fever of 102 F (38.9 C) or greater.  You develop worsening pain at or near the incision site. MAKE SURE YOU:   Understand these instructions.  Will watch your condition.  Will get help right away if you are not doing well or get worse. Document Released: 08/11/2006 Document Revised: 06/23/2011 Document Reviewed: 10/12/2006 [facility] Patient Information 2014 [place],  [place].   ________________________________________________________________________

## 2019-12-16 ENCOUNTER — Encounter (HOSPITAL_COMMUNITY)
Admission: RE | Admit: 2019-12-16 | Discharge: 2019-12-16 | Disposition: A | Discharge status: Home / Self Care | Payer: Medicare (Managed Care) | Source: Ambulatory Visit | Attending: Family Medicine | Admitting: Family Medicine

## 2019-12-16 NOTE — Progress Notes (Signed)
I called Pt for PAT visit with no answer and no way of leaving a message. I called his home number and it was forwarded to his son in [place]. The son said that he had" something to do at [place] long today". The Pt was a no show for his PAT appointment.

## 2019-12-26 ENCOUNTER — Other Ambulatory Visit (HOSPITAL_COMMUNITY): Payer: Medicare (Managed Care)

## 2019-12-28 MED ORDER — VANCOMYCIN HCL 1500 MG/300ML IV SOLN
1500.0000 mg | INTRAVENOUS | Status: DC
  Filled 2019-12-28: qty 300

## 2019-12-28 NOTE — Progress Notes (Signed)
Per phone call from [place] in the OR, this writer was asked to contact patient to see if he could come in today for his pre-surgical testing appointment for his surgical procedure scheduled for 12/29/19. (Pt previously was a 'no-show' for previously scheduled PST appt, and have not responded to messages left on 12/23/19 and 12/27/19 respectively).   Per pt, he was under the impression, that his surgical case was going to be rescheduled. He is not prepared to have surgery tomorrow, and will not be coming for his PST appt. Pt advised to contact surgeon's office to advise them of his decision.Marland KitchenMarland KitchenMarland KitchenPt verbalized understanding. [place] in [place], also advised of patient's decision, and will inform surgeon.

## 2019-12-29 ENCOUNTER — Ambulatory Visit (HOSPITAL_COMMUNITY)
Admission: RE | Admit: 2019-12-29 | Payer: Medicare (Managed Care) | Source: Home / Self Care | Admitting: General Surgery

## 2019-12-29 ENCOUNTER — Encounter (HOSPITAL_COMMUNITY): Admission: RE | Payer: Self-pay | Source: Home / Self Care

## 2019-12-29 SURGERY — REPAIR, HERNIA, INGUINAL, LAPAROSCOPIC
Anesthesia: General | Laterality: Right

## 2020-05-29 ENCOUNTER — Ambulatory Visit: Payer: Medicare (Managed Care) | Attending: Internal Medicine

## 2020-05-29 DIAGNOSIS — Z23 Encounter for immunization: Secondary | ICD-10-CM

## 2020-05-29 NOTE — Progress Notes (Signed)
   Covid-19 Vaccination Clinic  Name:  Walter Freeman    MRN: [id number] DOB: January 13, 1951  05/29/2020  Walter Freeman was observed post Covid-19 immunization for 30 minutes based on pre-vaccination screening without incident. He was provided with Vaccine Information Sheet and instruction to access the V-Safe system.   Walter Freeman was instructed to call 911 with any severe reactions post vaccine: Marland Kitchen Difficulty breathing  . Swelling of face and throat  . A fast heartbeat  . A bad rash all over body  . Dizziness and weakness   Immunizations Administered    Name Date Dose VIS Date Route   Moderna Covid-19 Booster Vaccine 05/29/2020  2:18 PM 0.25 mL 02/01/2020 Intramuscular   Manufacturer: Moderna   Lot: [id number]   NDC: 92119-417-40

## 2020-06-29 ENCOUNTER — Encounter (HOSPITAL_COMMUNITY): Payer: Self-pay | Admitting: Emergency Medicine

## 2020-06-29 ENCOUNTER — Emergency Department (HOSPITAL_COMMUNITY): Payer: Medicare (Managed Care)

## 2020-06-29 ENCOUNTER — Other Ambulatory Visit: Payer: Self-pay

## 2020-06-29 ENCOUNTER — Emergency Department (HOSPITAL_COMMUNITY)
Admission: EM | Admit: 2020-06-29 | Discharge: 2020-06-29 | Disposition: A | Discharge status: Home / Self Care | Payer: Medicare (Managed Care) | Attending: Emergency Medicine | Admitting: Emergency Medicine

## 2020-06-29 DIAGNOSIS — F419 Anxiety disorder, unspecified: Secondary | ICD-10-CM | POA: Diagnosis not present

## 2020-06-29 DIAGNOSIS — R0789 Other chest pain: Secondary | ICD-10-CM | POA: Diagnosis present

## 2020-06-29 DIAGNOSIS — R0602 Shortness of breath: Secondary | ICD-10-CM | POA: Diagnosis not present

## 2020-06-29 DIAGNOSIS — I10 Essential (primary) hypertension: Secondary | ICD-10-CM | POA: Diagnosis not present

## 2020-06-29 DIAGNOSIS — Z79899 Other long term (current) drug therapy: Secondary | ICD-10-CM | POA: Insufficient documentation

## 2020-06-29 LAB — BASIC METABOLIC PANEL
Anion gap: 8 (ref 5–15)
BUN: 15 mg/dL (ref 8–23)
CO2: 27 mmol/L (ref 22–32)
Calcium: 9.4 mg/dL (ref 8.9–10.3)
Chloride: 102 mmol/L (ref 98–111)
Creatinine, Ser: 1 mg/dL (ref 0.61–1.24)
GFR, Estimated: >60 mL/min (ref >60)
Glucose, Bld: 119 mg/dL — ABNORMAL HIGH (ref 70–99)
Potassium: 3.5 mmol/L (ref 3.5–5.1)
Sodium: 137 mmol/L (ref 135–145)

## 2020-06-29 LAB — CBC
HCT: 44.3 % (ref 39.0–52.0)
Hemoglobin: 15.4 g/dL (ref 13.0–17.0)
MCH: 30.4 pg (ref 26.0–34.0)
MCHC: 34.8 g/dL (ref 30.0–36.0)
MCV: 87.5 fL (ref 80.0–100.0)
Platelets: 310 10*3/uL (ref 150–400)
RBC: 5.06 MIL/uL (ref 4.22–5.81)
RDW: 13 % (ref 11.5–15.5)
WBC: 8.7 10*3/uL (ref 4.0–10.5)
nRBC: 0 % (ref 0.0–0.2)

## 2020-06-29 LAB — TROPONIN I (HIGH SENSITIVITY)
Troponin I (High Sensitivity): 3 ng/L (ref <18)
Troponin I (High Sensitivity): 3 ng/L (ref <18)

## 2020-06-29 LAB — D-DIMER, QUANTITATIVE: D-Dimer, Quant: 0.38 ug/mL-FEU (ref 0.00–0.50)

## 2020-06-29 NOTE — ED Provider Notes (Signed)
[facility]-EMERGENCY DEPT Provider Note   CSN: [id number] Arrival date & time: 06/29/20  1130     History Chief Complaint  Patient presents with  . Chest Pain    Walter Freeman is a 70 y.o. male.  The history is provided by the patient and medical records. No language interpreter was used.  Chest Pain  Walter Freeman is a 70 y.o. male who presents to the Emergency Department complaining of chest pain. He reports that the emergency department complaining of left sided chest pain and left arm pain. Symptoms started at last night. Pain is described as a squeezing sensation. It is waxing and waning but worse with sitting up and standing up. He did have brief shortness of breath this morning, but also has anxiety and is unsure if the anxiety was causing his symptoms. No fevers, vomiting, leg swelling. No prior similar symptoms. He took two ibuprofen at home with no significant change in his symptoms.    Past Medical History:  Diagnosis Date  . Anxiety   . Diverticulitis   . GERD (gastroesophageal reflux disease)   . Hyperlipidemia   . Hypertension   . IBS (irritable bowel syndrome)   . Paresthesias     Patient Active Problem List   Diagnosis Date Noted  . Perforated diverticulum of large intestine 07/12/2015    Past Surgical History:  Procedure Laterality Date  . ABDOMINAL SURGERY    . EYE SURGERY     fracture repair  . INGUINAL HERNIA REPAIR Right 08/2015       Family History  Problem Relation Age of Onset  . Diverticulitis Mother   . Lung cancer Sister   . COPD Sister     Social History   Tobacco Use  . Smoking status: Never Smoker  . Smokeless tobacco: Never Used  Vaping Use  . Vaping Use: Never used  Substance Use Topics  . Alcohol use: No  . Drug use: No    Home Medications Prior to Admission medications   Medication Sig Start Date End Date Taking? Authorizing Provider  ALPRAZolam Prudy Feeler) 0.25 MG tablet Take 0.25 mg by mouth  daily as needed for anxiety.    [provider]  amLODipine (NORVASC) 10 MG tablet Take 10 mg by mouth daily.    [provider]  hydrochlorothiazide (HYDRODIURIL) 25 MG tablet Take 25 mg by mouth every morning. 08/21/18   [provider]  omeprazole (PRILOSEC) 20 MG capsule Take 20 mg by mouth daily. 10/07/19   [provider]  tadalafil (CIALIS) 5 MG tablet Take 5 mg by mouth daily.     [provider]    Allergies    Lisinopril, Other, and Penicillins  Review of Systems   Review of Systems  Cardiovascular: Positive for chest pain.  All other systems reviewed and are negative.   Physical Exam Updated Vital Signs BP (!) 144/74   Pulse 75   Temp 98.7 F (37.1 C) (Oral)   Resp 15   Ht 5\' 11"  (1.803 m)   Wt 83 kg   SpO2 96%   BMI 25.52 kg/m   Physical Exam Vitals and nursing note reviewed.  Constitutional:      Appearance: He is well-developed.  HENT:     Head: Normocephalic and atraumatic.  Cardiovascular:     Rate and Rhythm: Normal rate and regular rhythm.     Heart sounds: No murmur heard.   Pulmonary:     Effort: Pulmonary effort is  normal. No respiratory distress.     Breath sounds: Normal breath sounds.  Chest:     Chest wall: Tenderness present.  Abdominal:     Palpations: Abdomen is soft.     Tenderness: There is no abdominal tenderness. There is no guarding or rebound.  Musculoskeletal:        General: No swelling or tenderness.     Comments: Range of motion intact and bilateral upper extremities. 2+ radial pulses bilaterally  Skin:    General: Skin is warm and dry.  Neurological:     Mental Status: He is alert and oriented to person, place, and time.  Psychiatric:        Behavior: Behavior normal.     ED Results / Procedures / Treatments   Labs (all labs ordered are listed, but only abnormal results are displayed) Labs Reviewed  BASIC METABOLIC PANEL - Abnormal; Notable for the following components:       Result Value   Glucose, Bld 119 (*)    All other components within normal limits  CBC  D-DIMER, QUANTITATIVE  TROPONIN I (HIGH SENSITIVITY)  TROPONIN I (HIGH SENSITIVITY)    EKG EKG Interpretation  Date/Time:  Friday June 29 2020 11:40:08 EDT Ventricular Rate:  83 PR Interval:    QRS Duration: 85 QT Interval:  341 QTC Calculation: 401 R Axis:   71 Text Interpretation: Sinus rhythm Posterior infarct, old 12 Lead; [place] No previous ECGs available Confirmed by Tilden Fossa [phone]) on 06/29/2020 2:34:40 PM   Radiology DG Chest 2 View  Result Date: 06/29/2020 CLINICAL DATA:  Pt reported left sided chest pain that started yesterday with some SOB today. Hx of HTN. CP EXAM: CHEST - 2 VIEW COMPARISON:  None. FINDINGS: Normal mediastinum and cardiac silhouette. Lungs are hyperinflated. Normal pulmonary vasculature. No evidence of effusion, infiltrate, or pneumothorax. No acute bony abnormality. IMPRESSION: Hyperinflated lungs.  No acute findings. Electronically Signed   By: Genevive Bi M.D.   On: 06/29/2020 12:17    Procedures Procedures   Medications Ordered in ED Medications - No data to display  ED Course  I have reviewed the triage vital signs and the nursing notes.  Pertinent labs & imaging results that were available during my care of the patient were reviewed by me and considered in my medical decision making (see chart for details).    MDM Rules/Calculators/A&P                         patient here for evaluation of left sided chest pain since yesterday. EKG is without acute ischemic changes in troponin is negative times two. He is low risk for PE and D dimer is negative. Presentation is not consistent with ACS, PE, dissection. Discussed with patient home care for musculoskeletal chest pain.    Final Clinical Impression(s) / ED Diagnoses Final diagnoses:  Chest wall pain    Rx / DC Orders ED Discharge Orders    None       Tilden Fossa,  MD 06/29/20 1554

## 2020-06-29 NOTE — ED Triage Notes (Signed)
Patient endorses chest pain since yesterday, L chest that radiates 'like a band' to his back that squeezes his arm. Endorses SOB since today, also reports 'real bad anxiety.' Denies N/V/D and fevers.

## 2020-07-10 ENCOUNTER — Ambulatory Visit: Payer: Medicare (Managed Care) | Attending: Family Medicine

## 2020-08-29 ENCOUNTER — Ambulatory Visit: Payer: Medicare (Managed Care)

## 2021-11-19 IMAGING — CR DG CHEST 2V
2 series · 2 of 2 positions shown · non-contrast
Comparison: None.

CLINICAL DATA: Pt reported left sided chest pain that started
yesterday with some SOB today. Hx of HTN. CP

EXAM:
CHEST - 2 VIEW

[w chest pa]
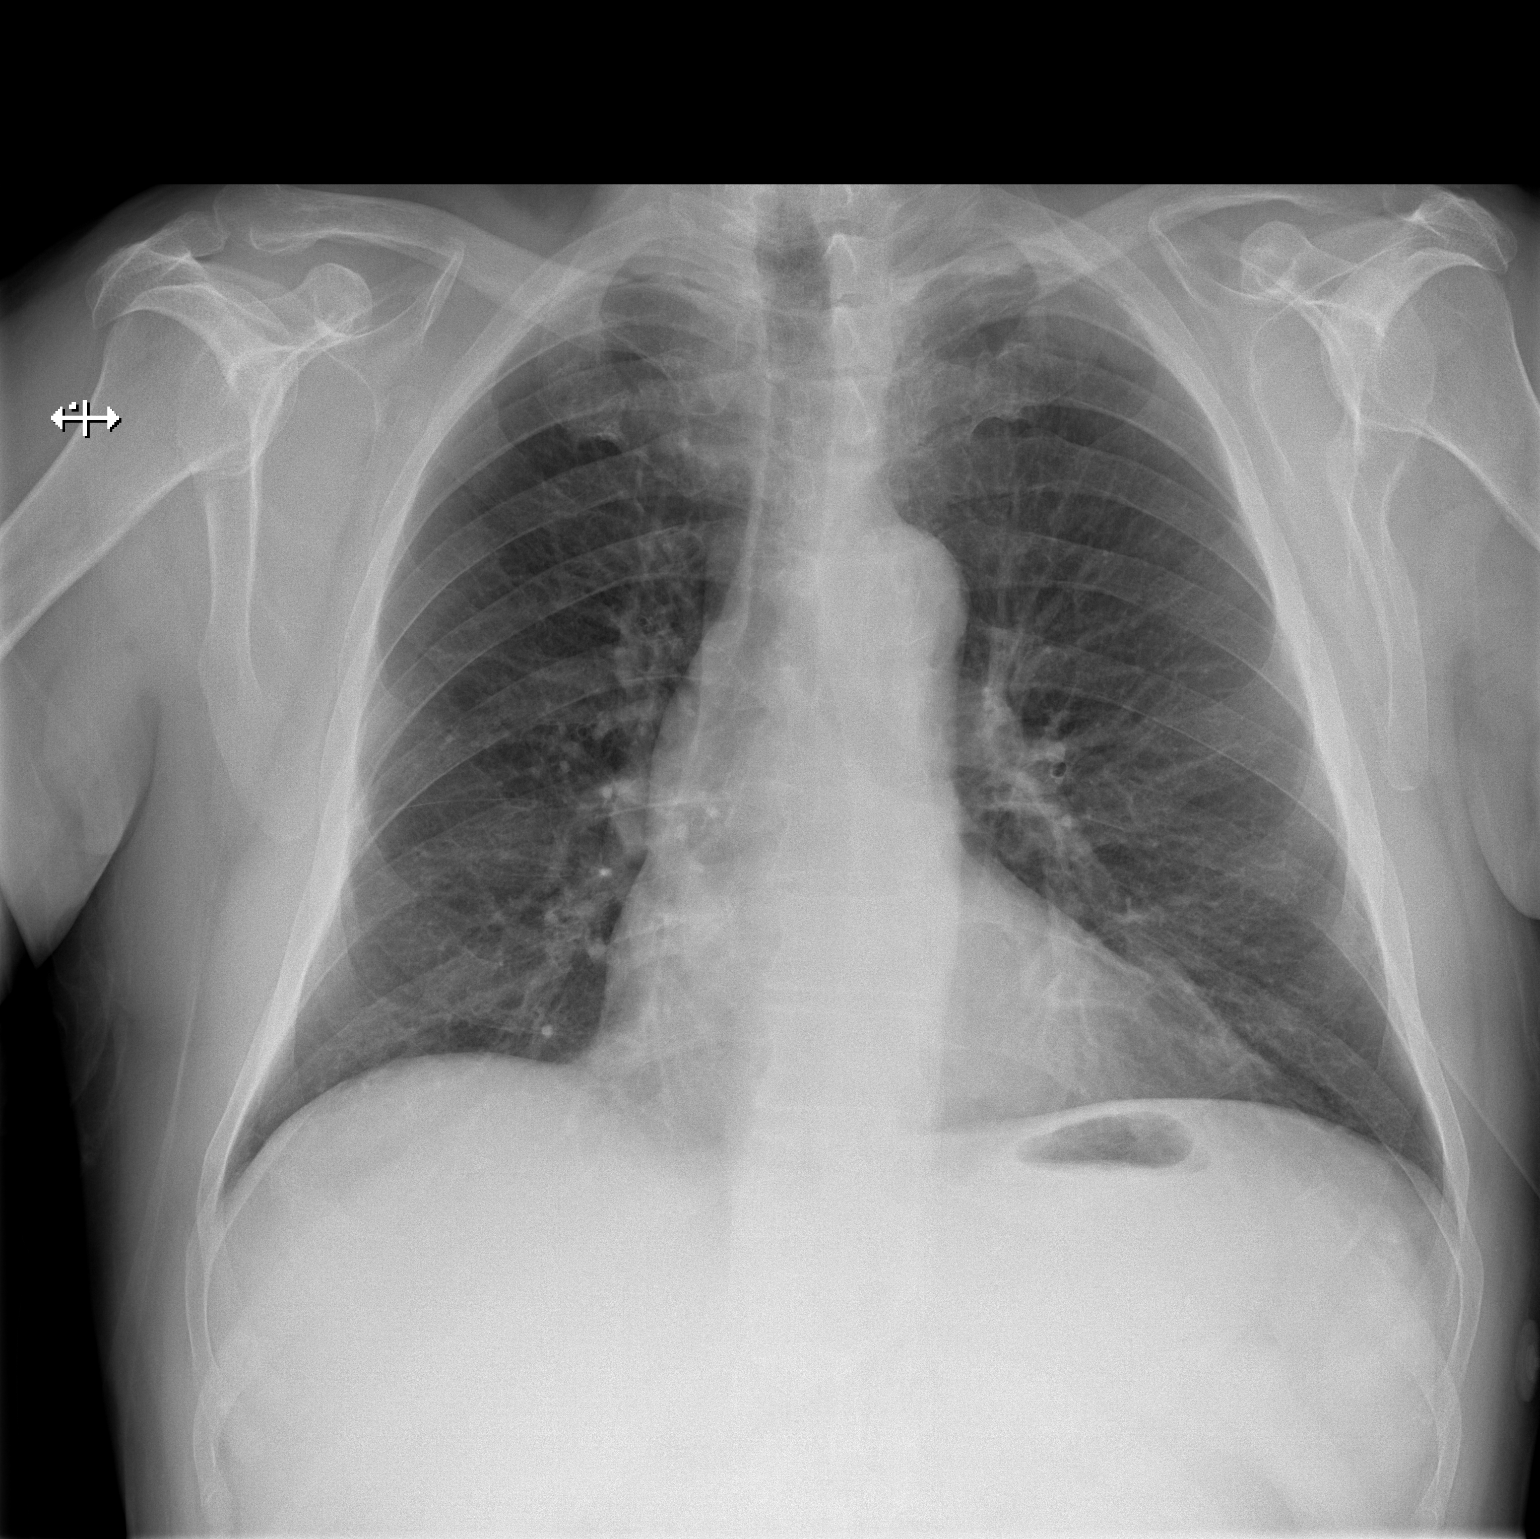

[w chest lat]
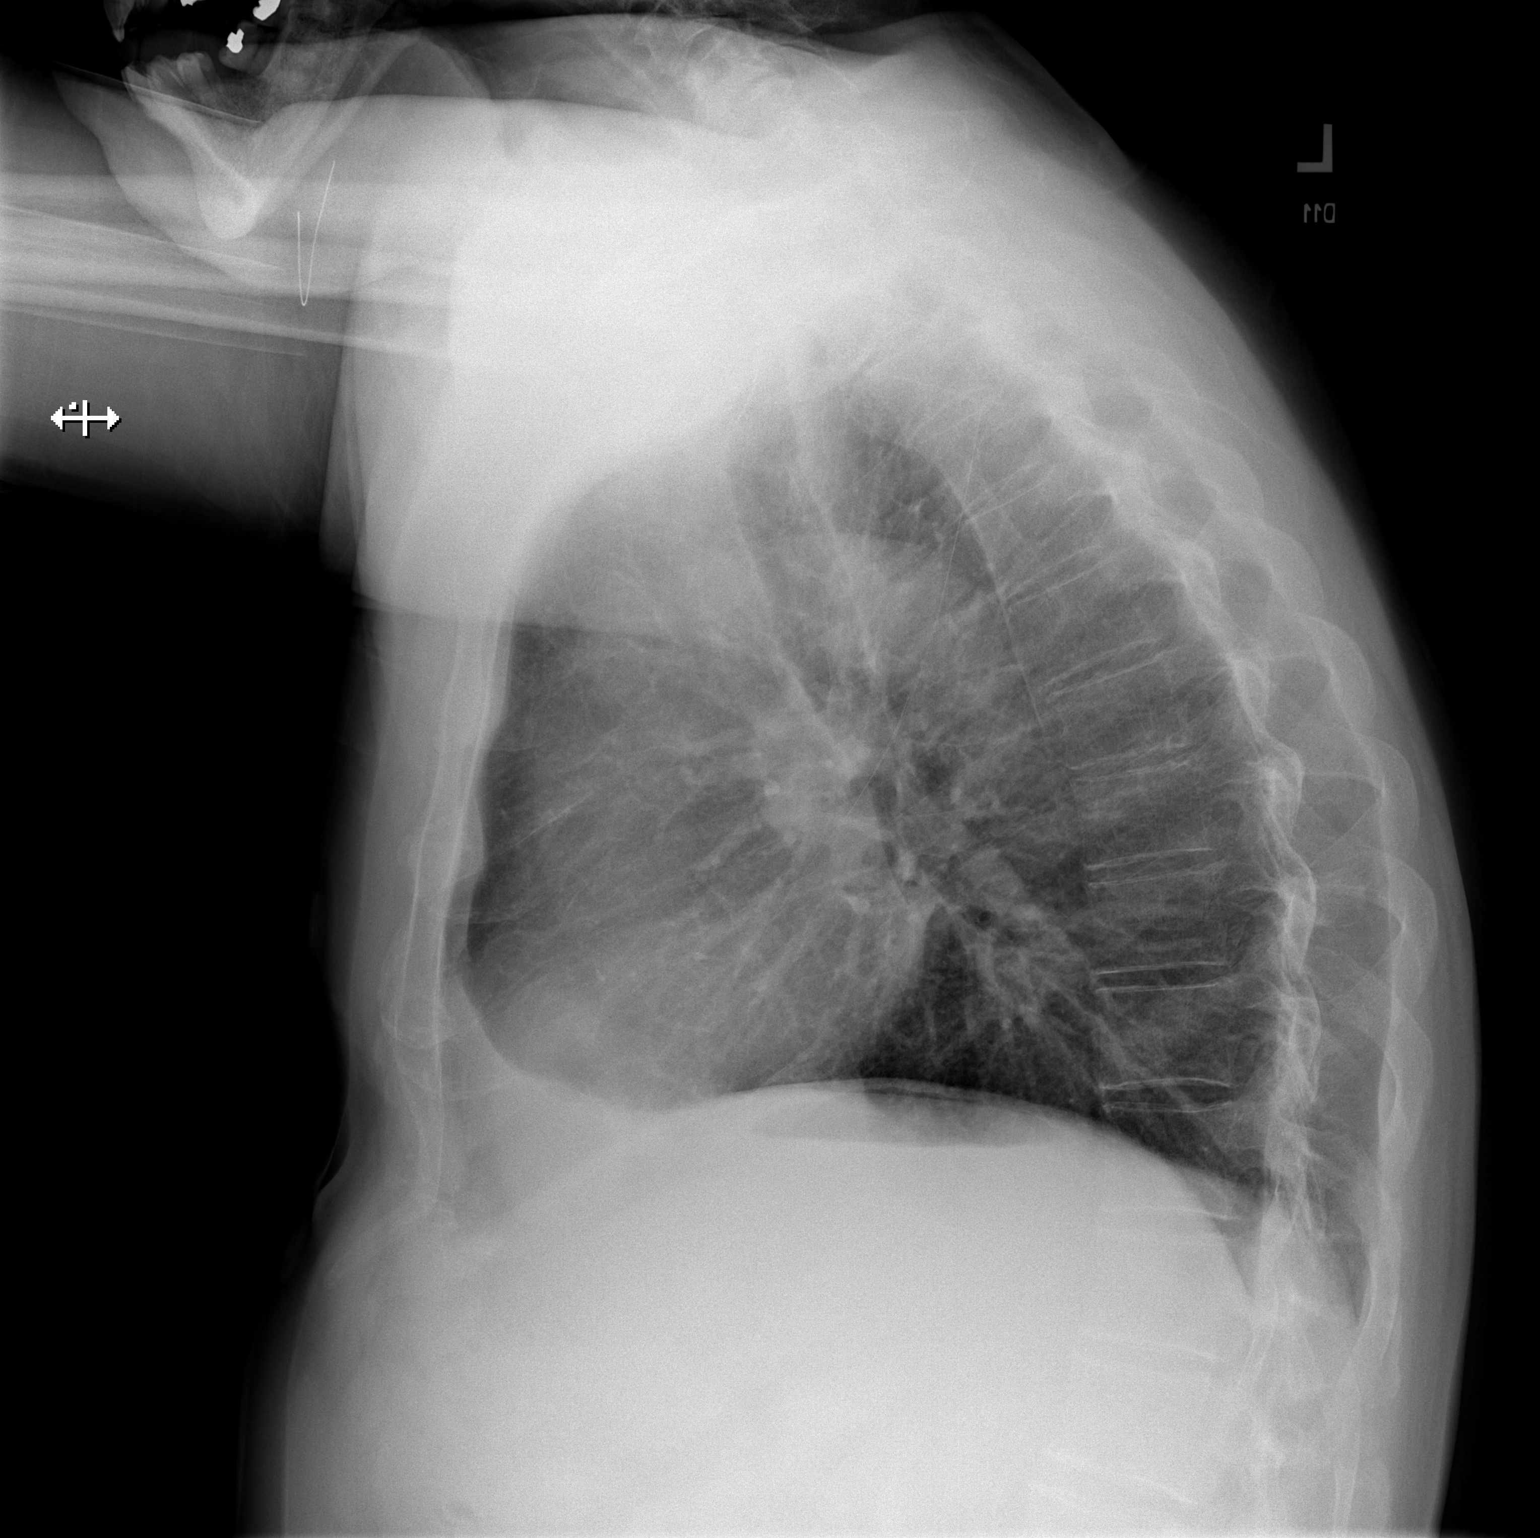

[2 of 2 positions shown; findings below may reference images not displayed]

FINDINGS: Normal mediastinum and cardiac silhouette. Lungs are hyperinflated.
Normal pulmonary vasculature. No evidence of effusion, infiltrate,
or pneumothorax. No acute bony abnormality.
IMPRESSION: Hyperinflated lungs.  No acute findings.
